# Patient Record
Sex: Male | Born: 1975 | Race: White | Hispanic: No | Marital: Married | Smoking: Never smoker
Health system: Southern US, Community
[De-identification: ages and names within clinical notes are randomized; demographics above are authoritative.]

## PROBLEM LIST (undated history)

## (undated) DIAGNOSIS — E785 Hyperlipidemia, unspecified: Secondary | ICD-10-CM

## (undated) DIAGNOSIS — S8290XA Unspecified fracture of unspecified lower leg, initial encounter for closed fracture: Secondary | ICD-10-CM

## (undated) DIAGNOSIS — Z808 Family history of malignant neoplasm of other organs or systems: Secondary | ICD-10-CM

## (undated) DIAGNOSIS — Z8049 Family history of malignant neoplasm of other genital organs: Secondary | ICD-10-CM

## (undated) DIAGNOSIS — Z800 Family history of malignant neoplasm of digestive organs: Secondary | ICD-10-CM

## (undated) DIAGNOSIS — E119 Type 2 diabetes mellitus without complications: Secondary | ICD-10-CM

## (undated) HISTORY — PX: APPENDECTOMY: SHX54

## (undated) HISTORY — DX: Family history of malignant neoplasm of other organs or systems: Z80.8

## (undated) HISTORY — DX: Hyperlipidemia, unspecified: E78.5

## (undated) HISTORY — DX: Type 2 diabetes mellitus without complications: E11.9

## (undated) HISTORY — PX: TESTICLE TORSION REDUCTION: SHX795

## (undated) HISTORY — DX: Family history of malignant neoplasm of digestive organs: Z80.0

## (undated) HISTORY — DX: Unspecified fracture of unspecified lower leg, initial encounter for closed fracture: S82.90XA

## (undated) HISTORY — DX: Family history of malignant neoplasm of other genital organs: Z80.49

---

## 2001-06-21 ENCOUNTER — Ambulatory Visit (HOSPITAL_COMMUNITY): Admission: RE | Admit: 2001-06-21 | Discharge: 2001-06-21 | Payer: Self-pay | Admitting: Urology

## 2001-06-21 ENCOUNTER — Encounter: Payer: Self-pay | Admitting: Urology

## 2002-03-31 ENCOUNTER — Encounter: Payer: Self-pay | Admitting: *Deleted

## 2002-03-31 ENCOUNTER — Emergency Department (HOSPITAL_COMMUNITY): Admission: EM | Admit: 2002-03-31 | Discharge: 2002-03-31 | Payer: Self-pay | Admitting: *Deleted

## 2003-01-14 ENCOUNTER — Emergency Department (HOSPITAL_COMMUNITY): Admission: EM | Admit: 2003-01-14 | Discharge: 2003-01-14 | Payer: Self-pay | Admitting: *Deleted

## 2003-02-03 ENCOUNTER — Emergency Department (HOSPITAL_COMMUNITY): Admission: EM | Admit: 2003-02-03 | Discharge: 2003-02-03 | Payer: Self-pay | Admitting: Emergency Medicine

## 2003-02-03 ENCOUNTER — Encounter: Payer: Self-pay | Admitting: Emergency Medicine

## 2004-12-18 ENCOUNTER — Ambulatory Visit (HOSPITAL_COMMUNITY): Admission: RE | Admit: 2004-12-18 | Discharge: 2004-12-18 | Payer: Self-pay | Admitting: Internal Medicine

## 2004-12-19 ENCOUNTER — Ambulatory Visit (HOSPITAL_COMMUNITY): Admission: RE | Admit: 2004-12-19 | Discharge: 2004-12-19 | Payer: Self-pay | Admitting: Internal Medicine

## 2005-06-19 ENCOUNTER — Ambulatory Visit (HOSPITAL_COMMUNITY): Admission: RE | Admit: 2005-06-19 | Discharge: 2005-06-19 | Payer: Self-pay | Admitting: Internal Medicine

## 2006-04-08 ENCOUNTER — Ambulatory Visit: Payer: Self-pay | Admitting: Internal Medicine

## 2006-04-14 ENCOUNTER — Ambulatory Visit: Payer: Self-pay | Admitting: Internal Medicine

## 2006-04-14 ENCOUNTER — Ambulatory Visit (HOSPITAL_COMMUNITY): Admission: RE | Admit: 2006-04-14 | Discharge: 2006-04-14 | Payer: Self-pay | Admitting: Internal Medicine

## 2006-04-16 ENCOUNTER — Ambulatory Visit (HOSPITAL_COMMUNITY): Admission: RE | Admit: 2006-04-16 | Discharge: 2006-04-16 | Payer: Self-pay | Admitting: Internal Medicine

## 2006-05-04 ENCOUNTER — Ambulatory Visit (HOSPITAL_COMMUNITY): Admission: RE | Admit: 2006-05-04 | Discharge: 2006-05-04 | Payer: Self-pay | Admitting: Internal Medicine

## 2006-08-05 ENCOUNTER — Encounter (INDEPENDENT_AMBULATORY_CARE_PROVIDER_SITE_OTHER): Payer: Self-pay | Admitting: Specialist

## 2006-08-05 ENCOUNTER — Observation Stay (HOSPITAL_COMMUNITY): Admission: EM | Admit: 2006-08-05 | Discharge: 2006-08-06 | Payer: Self-pay | Admitting: Emergency Medicine

## 2006-08-05 ENCOUNTER — Ambulatory Visit (HOSPITAL_COMMUNITY): Admission: RE | Admit: 2006-08-05 | Discharge: 2006-08-05 | Payer: Self-pay | Admitting: Internal Medicine

## 2007-03-12 ENCOUNTER — Encounter: Admission: RE | Admit: 2007-03-12 | Discharge: 2007-03-12 | Payer: Self-pay | Admitting: Occupational Medicine

## 2007-05-17 ENCOUNTER — Encounter: Admission: RE | Admit: 2007-05-17 | Discharge: 2007-05-17 | Payer: Self-pay | Admitting: Occupational Medicine

## 2007-06-21 ENCOUNTER — Ambulatory Visit: Admission: RE | Admit: 2007-06-21 | Discharge: 2007-06-21 | Payer: Self-pay | Admitting: Internal Medicine

## 2007-11-01 ENCOUNTER — Emergency Department (HOSPITAL_COMMUNITY): Admission: EM | Admit: 2007-11-01 | Discharge: 2007-11-01 | Payer: Self-pay | Admitting: Emergency Medicine

## 2007-11-02 ENCOUNTER — Ambulatory Visit (HOSPITAL_COMMUNITY): Admission: RE | Admit: 2007-11-02 | Discharge: 2007-11-02 | Payer: Self-pay | Admitting: Internal Medicine

## 2008-12-18 ENCOUNTER — Emergency Department (HOSPITAL_COMMUNITY): Admission: EM | Admit: 2008-12-18 | Discharge: 2008-12-18 | Payer: Self-pay | Admitting: Emergency Medicine

## 2009-06-29 ENCOUNTER — Ambulatory Visit (HOSPITAL_COMMUNITY): Admission: RE | Admit: 2009-06-29 | Discharge: 2009-06-29 | Payer: Self-pay | Admitting: General Surgery

## 2010-03-04 ENCOUNTER — Ambulatory Visit (HOSPITAL_COMMUNITY): Admission: RE | Admit: 2010-03-04 | Discharge: 2010-03-04 | Payer: Self-pay | Admitting: Family Medicine

## 2010-05-19 ENCOUNTER — Encounter: Payer: Self-pay | Admitting: Internal Medicine

## 2010-06-12 ENCOUNTER — Encounter: Payer: Self-pay | Admitting: Cardiology

## 2010-06-12 ENCOUNTER — Ambulatory Visit (INDEPENDENT_AMBULATORY_CARE_PROVIDER_SITE_OTHER): Payer: 59 | Admitting: Cardiology

## 2010-06-12 DIAGNOSIS — R079 Chest pain, unspecified: Secondary | ICD-10-CM

## 2010-06-12 DIAGNOSIS — E119 Type 2 diabetes mellitus without complications: Secondary | ICD-10-CM

## 2010-06-12 DIAGNOSIS — R9431 Abnormal electrocardiogram [ECG] [EKG]: Secondary | ICD-10-CM

## 2010-06-12 DIAGNOSIS — E785 Hyperlipidemia, unspecified: Secondary | ICD-10-CM | POA: Insufficient documentation

## 2010-06-12 DIAGNOSIS — E7800 Pure hypercholesterolemia, unspecified: Secondary | ICD-10-CM

## 2010-06-12 DIAGNOSIS — R209 Unspecified disturbances of skin sensation: Secondary | ICD-10-CM | POA: Insufficient documentation

## 2010-06-13 ENCOUNTER — Encounter: Payer: Self-pay | Admitting: Cardiology

## 2010-06-19 ENCOUNTER — Ambulatory Visit (HOSPITAL_COMMUNITY)
Admission: RE | Admit: 2010-06-19 | Discharge: 2010-06-19 | Disposition: A | Discharge status: Home / Self Care | Payer: 59 | Source: Ambulatory Visit | Attending: Cardiology | Admitting: Cardiology

## 2010-06-19 ENCOUNTER — Encounter: Payer: Self-pay | Admitting: Cardiology

## 2010-06-19 ENCOUNTER — Ambulatory Visit (HOSPITAL_COMMUNITY): Payer: 59 | Attending: Cardiology

## 2010-06-19 DIAGNOSIS — R0789 Other chest pain: Secondary | ICD-10-CM | POA: Insufficient documentation

## 2010-06-19 DIAGNOSIS — R079 Chest pain, unspecified: Secondary | ICD-10-CM | POA: Insufficient documentation

## 2010-06-19 DIAGNOSIS — R072 Precordial pain: Secondary | ICD-10-CM

## 2010-06-19 NOTE — Letter (Signed)
Summary: Stress Echocardiogram Information Sheet  [facility] at [facility]  [address] [place] [address]   Phone: [phone]  Fax: [phone]      June 12, 2010 MRN: [id number] light prior to the test.   Pastor Bunker  Doctor: Appointment Date: Appointment Time: Appointment Location: [facility]  Stress Echocardiogram Information Sheet    Instructions:   1. DO NOT  take your ___Glucovance______ medicine _the night before cath or the morning of test.  2. Do not eat or drink after midnight the night before test.  3. Dress prepared to exercise.  4. DO NOT use ANY caffine or tobacco products 3 hours before appointment.  5. Report to the Short Stay Center on the1st floor.  6. Please bring all current prescription medications.  7. If you have any questions, please call [phone]

## 2010-06-19 NOTE — Letter (Signed)
Summary: External Correspondence  External Correspondence   Imported By: Faythe Ghee 06/13/2010 10:58:01  _____________________________________________________________________  External Attachment:    Type:   Image     Comment:   External Document

## 2010-06-19 NOTE — Assessment & Plan Note (Signed)
Summary: ***NP6  CP, ARM NUMBNESS, NEW DX OF DM   Visit Type:  Initial Consult Primary Provider:  Dr. Assunta Found   History of Present Illness: 35 year old male referred for cardiology consultation. He has a history of recently diagnosed type 2 diabetes mellitus as of January, also a family history of premature cardiovascular disease. He states that he has lost 42 pounds over the last few months, and is beginning to feel better. He also reports compliance with his medications.  He states that over the last few months he has experienced a "numbness and tingling" in his left arm and left leg. He indicates that this has gotten much better with the interventions noted above. Only very occasionally does he experience a mild, lower mid chest discomfort, specifically when he is under stress or emotionally upset. He does not report any clear-cut exertional angina.  He states that he underwent a stress test approximately 5 years ago that was reportedly reassuring. He has had no interval followup, certainly not since his diagnosis of diabetes mellitus.  Current Medications (verified): 1)  Lipitor 10 Mg Tabs (Atorvastatin Calcium) .... Take 1 Tab Daily 2)  Glucovance 2.5-500 Mg Tabs (Glyburide-Metformin) .... Take 1 Tab Two Times A Day 3)  Claritin 10 Mg Tabs (Loratadine) .... Take 1 Tab Daily  Allergies (verified): 1)  ! * Pencillin  Comments:  Nurse/Medical Assistant: [facility] no meds reviewed meds from Dr.Goldings records  Past History:  Family History: Last updated: 06/12/2010 Father: heart disease in his 56s Mother: no known medical conditions Paternal uncle: died of MI at age 47  Social History: Last updated: 06/12/2010 Full Time - landscaping Married  Tobacco Use - No.  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  Alcohol Use - no Regular Exercise - no Drug Use - no  Review of Systems  The patient denies anorexia, fever, weight gain, syncope, dyspnea on exertion, peripheral edema, prolonged cough, hemoptysis, melena, hematochezia, and severe indigestion/heartburn.         Otherwise reviewed and negative except as outlined.  Vital Signs:  Patient profile:   35 year old male Height:      69 inches Weight:      212 pounds BMI:     31.42 Pulse rate:   85 / minute BP sitting:   115 / 75  (left arm)  Vitals Entered By: Dreama Saa, CNA (June 12, 2010 2:42 PM)  Physical Exam  Additional Exam:  Overweight male in no acute distress. HEENT: Conjunctiva and lids normal, oropharynx with moist mucosa. Neck: Supple, no elevated JVP or bruits. Lungs: Clear to auscultation, nonlabored. Cardiac: Regular rate and rhythm, no significant systolic murmur or pericardial rub, no S3. S1  Abdomen: Soft, nontender, bowel sounds present. Skin: Warm and dry. Musculoskeletal: No kyphosis. Neuropsychiatric: Alert and oriented x3, affect appropriate.   EKG  Procedure date:  06/12/2010  Findings:      Sinus rhythm at 78 beats per minute with nonspecific ST-T wave changes.  Impression & Recommendations:  Problem # 1:  CHEST PAIN (ICD-786.50)  Largely atypical, associated with emotional upset and stress, not  clearly exertional. This is noted however in the setting of nonspecific abnormal ECG at baseline, recently diagnosed diabetes mellitus with significant hypertriglyceridemia, and a family history of premature cardiovascular disease. Plan will be to further risk stratify with an exercise echocardiogram. We will inform him of the results.  Orders: Stress Echo (Stress Echo)  Problem #  2:  ARM NUMBNESS (ICD-782.0)  Specifically left arm and left leg numbness, nonexertional, actually much improved per patient report. Very atypical for ischemic symptoms. Could perhaps be neuropathic.  Problem # 3:  DM (ICD-250.00)  Recently diagnosed in January. Patient reports compliance with medications, and is working on continued weight loss.  His updated medication list for this problem includes:    Glucovance 2.5-500 Mg Tabs (Glyburide-metformin) .Marland Kitchen... Take 1 tab two times a day  Problem # 4:  HYPERLIPIDEMIA (ICD-272.4)  Now on statin therapy. Triglycerides should improve with better glucose control. May need to also consider using omega-3 supplements.  His updated medication list for this problem includes:    Lipitor 10 Mg Tabs (Atorvastatin calcium) .Marland Kitchen... Take 1 tab daily  Patient Instructions: 1)  Your physician recommends that you schedule a follow-up appointment in: we will contact you with results of Stress Echo 2)  Your physician recommends that you continue on your current medications as directed. Please refer to the Current Medication list given to you today. 3)  Your physician has requested that you have a stress echocardiogram. For further information please visit [email or web address]  Please follow instruction sheet as given.

## 2010-06-20 ENCOUNTER — Encounter: Payer: Self-pay | Admitting: Cardiology

## 2010-08-03 LAB — URINALYSIS, ROUTINE W REFLEX MICROSCOPIC
Ketones, ur: NEGATIVE mg/dL
Leukocytes, UA: NEGATIVE
Nitrite: NEGATIVE
Protein, ur: NEGATIVE mg/dL
pH: 5.5 (ref 5.0–8.0)

## 2010-09-10 NOTE — Procedures (Signed)
NAMEHELTON, Cameron Lee                  ACCOUNT NO.:  [id number]   MEDICAL RECORD NO.:  [id number]          PATIENT TYPE:  OUT   LOCATION:  SLEE                          FACILITY:  [facility]   PHYSICIAN:  Kofi A. Gerilyn Pilgrim, M.D. DATE OF BIRTH:  1975/05/12   DATE OF PROCEDURE:  06/21/2007  DATE OF DISCHARGE:  06/21/2007                             SLEEP DISORDER REPORT   REFERRING PHYSICIAN:  [name], MD   INDICATION:  This is a 35 year old who presents with hypersomnia and is  being evaluated for obstructive sleep apnea syndrome.  Epworth  sleepiness  scale of 12.   MEDICATIONS:  Allegra.   BMI 35.   Sleep Study Summary:  This was a split-night study with a total  recording time in  the diagnostic portion 158 minutes and the titration  time 293 minutes.  Sleep efficiency during the diagnostic portion is 57%  and 91% in the titration portion.  The sleep latency is 23.  Diagnostics  portion, there is no REM in the diagnostic portion.  The REM latency in  the titration portion is 113 minutes.   Respiratory Summary:  Baseline oxygen saturation 98% and lowest oxygen  saturation 87%.  The AHI is 36.1 with 55 hypopneas.  There were no  apneas noted.  The patient was titrated between the pressure of 5 and 7  with an optimal pressure of 6.  She had no events on the optimal  pressure.   Limb Movement Summary:  The PLM index is 4.   Electrocardiogram Index:  Average heart rate 63 with no dysrhythmias  noted.   IMPRESSION:  Moderately severe obstructive sleep apnea syndrome which  responded very well to a CPAP of 6.      Kofi A. Gerilyn Pilgrim, M.D.  Electronically Signed     KAD/MEDQ  D:  06/23/2007  T:  06/23/2007  Job:  [id number]

## 2010-09-13 NOTE — Op Note (Signed)
Cameron Lee, Cameron Lee                  ACCOUNT NO.:  [id number]   MEDICAL RECORD NO.:  [id number]          PATIENT TYPE:  AMB   LOCATION:  DAY                           FACILITY:  [facility]   PHYSICIAN:  Lionel December, M.D.    DATE OF BIRTH:  1975/05/09   DATE OF PROCEDURE:  04/14/2006  DATE OF DISCHARGE:                               OPERATIVE REPORT   PROCEDURE:  Esophagogastroduodenoscopy.   INDICATION:  Cameron Lee is a 35 year old Caucasian male with over a month  history of epigastric pain associated with some nausea which has not  responded to PPI.  He had an ultrasound last year which was negative for  cholelithiasis.  He is undergoing diagnostic EGD.  Procedure risks were  reviewed with the patient, informed consent was obtained.   MEDICATIONS FOR CONSCIOUS SEDATION:  Benzocaine spray for oropharyngeal  topical anesthesia, Demerol 50 mg IV and Versed 8 mg IV.   FINDINGS:  Procedure performed in endoscopy suite.  The patient's vital  signs and O2 sats were monitored during the procedure and remained  stable.  The patient was placed in the left lateral recumbent position  and Olympus videoscope was passed per oropharynx without any difficulty  into esophagus.   Esophagus:  Mucosa of esophagus was normal.  GE junction was at 40 cm  from the incisors was unremarkable.   Stomach:  It was empty and distended very well with insufflation.  Folds  of proximal stomach were normal.  Mucosa at body, antrum, pyloric  channel as well as angularis, fundus and cardia was normal.   Duodenum:  Bulbar mucosa was normal.  Scope was passed to second part of  the duodenum where mucosa and folds were normal.  Endoscope was  withdrawn.  The patient tolerated the procedure well.   FINAL DIAGNOSIS:  Normal esophagogastroduodenoscopy.   RECOMMENDATIONS:  Will discontinue his Levsin SL and start Levbid 1  tablet every morning, prescription given for 30 with five refills.   Will proceed with abdominal CT but  this will have to be first  precertified by his plan.      Lionel December, M.D.  Electronically Signed     NR/MEDQ  D:  04/14/2006  T:  04/14/2006  Job:  [id number]   cc:   [name], MD  Fax: [phone]

## 2010-09-13 NOTE — Procedures (Signed)
NAMELEODAN, Cameron Lee NO.:  [id number]   MEDICAL RECORD NO.:  [id number]          PATIENT TYPE:  OUT   LOCATION:  DFTL                          FACILITY:  [facility]   PHYSICIAN:  [name], MD       DATE OF BIRTH:  1976/02/07   DATE OF PROCEDURE:  12/19/2004  DATE OF DISCHARGE:                                    STRESS TEST   HISTORY OF PRESENT ILLNESS:  The patient underwent an exercise stress test  for evaluation of recent symptoms of chest pain and shortness of breath.  He  exercised 10 minutes (one minute into stage 4 of the Bruce protocol)  obtaining a maximal heart rate of 186 (97% of the age predicted maximum  heart rate), had a work load of 12.8 mets and discontinued exercise due to  fatigue.  There were no symptoms of chest pain.  There were no arrhythmias.  There were no ST segment changes diagnostic of ischemia.  His baseline  electrocardiogram revealed normal sinus rhythm at 80 beats per minute with  nonspecific T wave changes.   IMPRESSION:  No evidence of exercise induced ischemia.  He had a negative  chest x-ray yesterday. His oxygen saturation was 100%.  His blood counts  were normal.  His chemistries revealed mild transaminase elevations that  will be rechecked in two months.  He felt his recent symptoms were likely  anxiety related.  He was treated with Klonopin 0.5 mg t.i.d. p.r.n. #30 with  3 refills.      [name], MD  Electronically Signed     ROF/MEDQ  D:  12/19/2004  T:  12/20/2004  Job:  [id number]

## 2010-09-13 NOTE — Consult Note (Signed)
Cameron, Lee                  ACCOUNT NO.:  [id number]   MEDICAL RECORD NO.:  [id number]         PATIENT TYPE:  AMB   LOCATION:                                FACILITY:  [facility]   PHYSICIAN:  Cameron Lee, M.D.    DATE OF BIRTH:  Oct 12, 1975   DATE OF CONSULTATION:  04/08/2006  DATE OF DISCHARGE:                                 CONSULTATION   REASON FOR CONSULTATION:  Epigastric pain.   REQUESTING PHYSICIAN:  Cameron Callander. Cameron Sills, MD   HISTORY OF PRESENT ILLNESS:  Cameron Lee is a 36 year old Caucasian gentleman  who has a 2-3 week history of epigastric pain which is intermittent but  daily.  It does not seem to be related to meals.  It sometimes seems to  be exacerbated if he lifts heavy objects.  Last week, he noted it to be  worse when he was swallowing food; however, he denies any dysphagia.  No  nausea or vomiting.  For several months, he has had soft to loose  stools, 4-5 daily.  Dr. Ouida Lee has had him on hyoscyamine for possible  IBS.  He also has intermittent hematochezia, bright red blood per  rectum, sometimes moderate volume.  Denies any melena.  He said he had a  colonoscopy a couple of years ago by Dr. Lovell Lee.  Was told he had  internal hemorrhoids.   Patient has a history of fatty liver.  He states that he lost about 20  pounds but gained it all back.  He started his diet again this week.  He  has had elevated LFTs in the past related to his fatty liver.  Apparently had an ultrasound in 2006 which revealed fatty liver but no  gallstones.  He had a negative exercise stress test in 2006.   CURRENT MEDICATIONS:  1. Zyrtec 1 daily.  2. Hyoscyamine 1 daily.  3. Sertraline 1 daily.  4. Nexium 40 mg daily.   ALLERGIES:  PENICILLIN.   PAST MEDICAL HISTORY:  Seasonal allergies, anxiety, irritability,  insomnia.   He had some sort of surgery at age 16.  He is not sure if it was related  to his inguinal hernia or maybe a testicle torsion.  Details  unavailable.   FAMILY HISTORY:   Mother is 64 and has breast cancer.  Father is 9, has  heart disease.  No family history of colorectal cancer.   SOCIAL HISTORY:  He is married with a 48-year-old son.  He works at [employer].  He has never been a smoker, rarely consumes  alcohol.  Has not consumed alcohol in many months.   REVIEW OF SYSTEMS:  See HPI for GI.  CARDIOPULMONARY:  No chest pain or  shortness of breath, palpitations.   PHYSICAL EXAMINATION:  VITAL SIGNS:  Weight 234, height 5 feet 9.  Temperature 97.9, blood pressure 138/88, pulse 60.  GENERAL:  A pleasant, moderately obese Caucasian male in no acute  distress.  SKIN:  Warm and dry.  No jaundice.  HEENT:  Sclerae are anicteric.  Oropharyngeal mucosa moist and pink.  No  lesions, erythema, or exudate.  NECK:  No lymphadenopathy or thyromegaly.  CHEST:  Lungs are clear to auscultation.  CARDIAC:  Regular rate and rhythm.  No murmurs, rubs or gallops.  ABDOMEN:  Positive bowel sounds.  Soft, nondistended.  Slightly obese  but symmetrical.  He has mild epigastric tenderness to deep palpation.  No obvious abdominal hernias.  No abdominal bruits.  EXTREMITIES:  No edema.   IMPRESSION:  Cameron Lee is a 35 year old gentleman with a several-week history  of epigastric discomfort, which has not responded to Nexium.  He does  not really describe typical reflux symptoms.  Cannot exclude the  possibility of hiatal hernia, gastritis.  Doubt peptic ulcer disease.  He is not on any NSAIDs or aspirin.  He also complains of frequent soft  stools, especially post prandially.  He may have irritable bowel  syndrome.  He also has some hematochezia, possibly due to a benign  anorectal source such as internal hemorrhoids, but we need to retrieve  his last colonoscopy report to make sure it is up to date.   PLAN:  1. EGD in the near future with Dr. Karilyn Cota.  2. Continue Nexium 40 mg daily.  Two boxes of samples provided.  3. Retrieve colonoscopy report from  [facility] with Dr.      Lovell Lee, done a couple of years ago.  4. Recommend slow gradual weight loss, daily exercise, and recheck      LFTs in a few months.  Can be done with Dr. Ouida Lee for fatty liver.      Cameron Lee, P.A.      Cameron Lee, M.D.  Electronically Signed    LL/MEDQ  D:  04/08/2006  T:  04/08/2006  Job:  [id number]   cc:   Cameron Callander. Cameron Sills, MD  Fax: [phone]

## 2010-09-13 NOTE — Op Note (Signed)
NAMECRISTOFHER, Cameron Lee NO.:  [id number]   MEDICAL RECORD NO.:  [id number]          PATIENT TYPE:  OBV   LOCATION:  A311                          FACILITY:  [facility]   PHYSICIAN:  Dalia Heading, M.D.  DATE OF BIRTH:  03/15/1976   DATE OF PROCEDURE:  08/05/2006  DATE OF DISCHARGE:                               OPERATIVE REPORT   PREOPERATIVE DIAGNOSIS:  Acute appendicitis.   POSTOPERATIVE DIAGNOSIS:  Acute appendicitis.   PROCEDURE:  Laparoscopic appendectomy.   SURGEON:  Dalia Heading, M.D.   ANESTHESIA:  General endotracheal.   INDICATIONS:  The patient is a 35 year old white male who presented to  Dr. Alonza Smoker office with right lower quadrant abdominal pain.  CT scan of  the abdomen and pelvis was performed which revealed acute appendicitis.  The patient now comes to the operating room for laparoscopic  appendectomy.  The risks and benefits of the procedure including  bleeding, infection, and the possibility of an open procedure were fully  explained to the patient who gave informed consent.   PROCEDURE NOTE:  The patient was placed in the supine position.  After  induction of general endotracheal anesthesia, the abdomen was prepped  and draped using the usual sterile technique with Betadine.  Surgical  site confirmation was performed.  A supraumbilical incision was made  down to the fascia.  A Veress needle was introduced into the abdominal  cavity and confirmation of placement was done using the saline drop  test.  The abdomen was then insufflated to 16 mmHg pressure.  An 11 mm  trocar was introduced into the abdominal cavity under direct  visualization without difficulty.  The patient was placed in deeper  Trendelenburg position.  An additional 12 mm trocar was placed in the  suprapubic region and 5 mm trocar was placed in the left lower quadrant  region.  The appendix was visualized and noted to be acutely inflamed.  There was no evidence of  perforation.  The mesoappendix was divided  using the harmonic scalpel.  Vascular Endo-GIA was placed across the  base of the appendix and fired.  The appendix was removed using the  EndoCatch bag without difficulty.  The appendiceal stump was inspected  and the staple line was noted be intact.  All fluid and air were then  evacuated from the abdominal cavity prior to removal of the trocars.   All wounds were irrigated with normal saline.  All wounds were injected  0.5% Sensorcaine.  The supraumbilical fascia as well as suprapubic  fascia were reapproximated using 0 Vicryl interrupted sutures.  All skin  incisions were closed using staples.  Betadine ointment and dry sterile  dressings were applied.  All tape and needle counts were correct at the  end of the procedure.  The patient was extubated in the operating room  and went back to the recovery room awake in stable condition.   COMPLICATIONS:  None.   SPECIMEN:  Appendix.   BLOOD LOSS:  Minimal.      Dalia Heading, M.D.  Electronically Signed  MAJ/MEDQ  D:  08/05/2006  T:  08/05/2006  Job:  [id number]   cc:   [name], MD  Fax: [phone]

## 2011-01-23 LAB — DIFFERENTIAL
Basophils Relative: 0
Eosinophils Relative: 1
Lymphocytes Relative: 20
Monocytes Absolute: 1.2 — ABNORMAL HIGH
Monocytes Relative: 8

## 2011-01-23 LAB — BASIC METABOLIC PANEL
BUN: 19
Calcium: 9.3
Creatinine, Ser: 0.93
GFR calc Af Amer: >60
GFR calc non Af Amer: >60
Glucose, Bld: 126 — ABNORMAL HIGH
Potassium: 3.7
Sodium: 140

## 2011-01-23 LAB — CBC
Hemoglobin: 14.8
MCHC: 33.7

## 2011-01-23 LAB — POCT CARDIAC MARKERS
CKMB, poc: 1.6
Operator id: 179121

## 2011-01-23 LAB — CK TOTAL AND CKMB (NOT AT ARMC)
CK, MB: 1.3
Relative Index: 1
Total CK: 127

## 2011-01-23 LAB — TROPONIN I: Troponin I: 0.03

## 2011-04-14 ENCOUNTER — Encounter: Payer: Self-pay | Admitting: Cardiology

## 2012-01-19 ENCOUNTER — Other Ambulatory Visit (HOSPITAL_COMMUNITY): Payer: Self-pay | Admitting: Family Medicine

## 2012-01-19 ENCOUNTER — Ambulatory Visit (HOSPITAL_COMMUNITY)
Admission: RE | Admit: 2012-01-19 | Discharge: 2012-01-19 | Disposition: A | Discharge status: Home / Self Care | Payer: 59 | Source: Ambulatory Visit | Attending: Family Medicine | Admitting: Family Medicine

## 2012-01-19 DIAGNOSIS — R1012 Left upper quadrant pain: Secondary | ICD-10-CM

## 2012-01-19 LAB — POCT I-STAT, CHEM 8
Calcium, Ion: 1.19 mmol/L (ref 1.12–1.23)
HCT: 48 % (ref 39.0–52.0)
Sodium: 140 mEq/L (ref 135–145)
TCO2: 24 mmol/L (ref 0–100)

## 2012-01-19 MED ORDER — IOHEXOL 300 MG/ML  SOLN
100.0000 mL | Freq: Once | INTRAMUSCULAR | Status: AC | PRN
  Administered 2012-01-19: 100 mL via INTRAVENOUS

## 2012-01-19 NOTE — Progress Notes (Signed)
Blood sample obtained from left arm IV for Creatnine level.  

## 2016-04-28 DIAGNOSIS — G4733 Obstructive sleep apnea (adult) (pediatric): Secondary | ICD-10-CM | POA: Diagnosis not present

## 2016-05-03 DIAGNOSIS — G8929 Other chronic pain: Secondary | ICD-10-CM | POA: Diagnosis not present

## 2016-05-03 DIAGNOSIS — M25512 Pain in left shoulder: Secondary | ICD-10-CM | POA: Diagnosis not present

## 2016-05-27 ENCOUNTER — Encounter: Payer: Self-pay | Admitting: Internal Medicine

## 2016-05-29 DIAGNOSIS — G4733 Obstructive sleep apnea (adult) (pediatric): Secondary | ICD-10-CM | POA: Diagnosis not present

## 2016-06-26 DIAGNOSIS — G4733 Obstructive sleep apnea (adult) (pediatric): Secondary | ICD-10-CM | POA: Diagnosis not present

## 2016-09-26 DIAGNOSIS — E119 Type 2 diabetes mellitus without complications: Secondary | ICD-10-CM | POA: Diagnosis not present

## 2016-09-26 DIAGNOSIS — E782 Mixed hyperlipidemia: Secondary | ICD-10-CM | POA: Diagnosis not present

## 2016-09-26 DIAGNOSIS — E1165 Type 2 diabetes mellitus with hyperglycemia: Secondary | ICD-10-CM | POA: Diagnosis not present

## 2016-12-31 DIAGNOSIS — E119 Type 2 diabetes mellitus without complications: Secondary | ICD-10-CM | POA: Diagnosis not present

## 2016-12-31 DIAGNOSIS — Z1389 Encounter for screening for other disorder: Secondary | ICD-10-CM | POA: Diagnosis not present

## 2016-12-31 DIAGNOSIS — E782 Mixed hyperlipidemia: Secondary | ICD-10-CM | POA: Diagnosis not present

## 2016-12-31 DIAGNOSIS — E1165 Type 2 diabetes mellitus with hyperglycemia: Secondary | ICD-10-CM | POA: Diagnosis not present

## 2017-05-07 DIAGNOSIS — E119 Type 2 diabetes mellitus without complications: Secondary | ICD-10-CM | POA: Diagnosis not present

## 2017-05-07 DIAGNOSIS — Z6830 Body mass index (BMI) 30.0-30.9, adult: Secondary | ICD-10-CM | POA: Diagnosis not present

## 2017-05-07 DIAGNOSIS — E6609 Other obesity due to excess calories: Secondary | ICD-10-CM | POA: Diagnosis not present

## 2017-09-07 DIAGNOSIS — E782 Mixed hyperlipidemia: Secondary | ICD-10-CM | POA: Diagnosis not present

## 2017-09-07 DIAGNOSIS — Z1389 Encounter for screening for other disorder: Secondary | ICD-10-CM | POA: Diagnosis not present

## 2017-09-07 DIAGNOSIS — K760 Fatty (change of) liver, not elsewhere classified: Secondary | ICD-10-CM | POA: Diagnosis not present

## 2017-09-07 DIAGNOSIS — E1165 Type 2 diabetes mellitus with hyperglycemia: Secondary | ICD-10-CM | POA: Diagnosis not present

## 2017-11-30 DIAGNOSIS — S134XXA Sprain of ligaments of cervical spine, initial encounter: Secondary | ICD-10-CM | POA: Diagnosis not present

## 2017-11-30 DIAGNOSIS — S233XXA Sprain of ligaments of thoracic spine, initial encounter: Secondary | ICD-10-CM | POA: Diagnosis not present

## 2017-11-30 DIAGNOSIS — M531 Cervicobrachial syndrome: Secondary | ICD-10-CM | POA: Diagnosis not present

## 2017-12-25 DIAGNOSIS — E782 Mixed hyperlipidemia: Secondary | ICD-10-CM | POA: Diagnosis not present

## 2017-12-25 DIAGNOSIS — K760 Fatty (change of) liver, not elsewhere classified: Secondary | ICD-10-CM | POA: Diagnosis not present

## 2017-12-25 DIAGNOSIS — E663 Overweight: Secondary | ICD-10-CM | POA: Diagnosis not present

## 2017-12-25 DIAGNOSIS — E1165 Type 2 diabetes mellitus with hyperglycemia: Secondary | ICD-10-CM | POA: Diagnosis not present

## 2018-01-24 ENCOUNTER — Emergency Department (HOSPITAL_COMMUNITY)
Admission: EM | Admit: 2018-01-24 | Discharge: 2018-01-24 | Disposition: A | Discharge status: Home / Self Care | Payer: 59 | Attending: Emergency Medicine | Admitting: Emergency Medicine

## 2018-01-24 ENCOUNTER — Emergency Department (HOSPITAL_COMMUNITY): Payer: 59

## 2018-01-24 ENCOUNTER — Encounter (HOSPITAL_COMMUNITY): Payer: Self-pay | Admitting: Emergency Medicine

## 2018-01-24 ENCOUNTER — Other Ambulatory Visit: Payer: Self-pay

## 2018-01-24 DIAGNOSIS — E119 Type 2 diabetes mellitus without complications: Secondary | ICD-10-CM | POA: Diagnosis not present

## 2018-01-24 DIAGNOSIS — Y990 Civilian activity done for income or pay: Secondary | ICD-10-CM | POA: Insufficient documentation

## 2018-01-24 DIAGNOSIS — Y93H3 Activity, building and construction: Secondary | ICD-10-CM | POA: Insufficient documentation

## 2018-01-24 DIAGNOSIS — W19XXXA Unspecified fall, initial encounter: Secondary | ICD-10-CM

## 2018-01-24 DIAGNOSIS — W1789XA Other fall from one level to another, initial encounter: Secondary | ICD-10-CM | POA: Diagnosis not present

## 2018-01-24 DIAGNOSIS — T7840XA Allergy, unspecified, initial encounter: Secondary | ICD-10-CM | POA: Diagnosis not present

## 2018-01-24 DIAGNOSIS — M546 Pain in thoracic spine: Secondary | ICD-10-CM | POA: Diagnosis not present

## 2018-01-24 DIAGNOSIS — Y9269 Other specified industrial and construction area as the place of occurrence of the external cause: Secondary | ICD-10-CM | POA: Diagnosis not present

## 2018-01-24 DIAGNOSIS — S22080A Wedge compression fracture of T11-T12 vertebra, initial encounter for closed fracture: Secondary | ICD-10-CM | POA: Diagnosis not present

## 2018-01-24 DIAGNOSIS — R0600 Dyspnea, unspecified: Secondary | ICD-10-CM | POA: Diagnosis not present

## 2018-01-24 DIAGNOSIS — Z7984 Long term (current) use of oral hypoglycemic drugs: Secondary | ICD-10-CM | POA: Diagnosis not present

## 2018-01-24 DIAGNOSIS — S299XXA Unspecified injury of thorax, initial encounter: Secondary | ICD-10-CM | POA: Diagnosis not present

## 2018-01-24 DIAGNOSIS — T63441A Toxic effect of venom of bees, accidental (unintentional), initial encounter: Secondary | ICD-10-CM | POA: Diagnosis not present

## 2018-01-24 LAB — BASIC METABOLIC PANEL
ANION GAP: 14 (ref 5–15)
BUN: 14 mg/dL (ref 6–20)
CHLORIDE: 102 mmol/L (ref 98–111)
CO2: 23 mmol/L (ref 22–32)
Calcium: 9.5 mg/dL (ref 8.9–10.3)
Creatinine, Ser: 1.03 mg/dL (ref 0.61–1.24)
GFR calc Af Amer: >60 mL/min (ref >60)
GLUCOSE: 343 mg/dL — AB (ref 70–99)
POTASSIUM: 4.1 mmol/L (ref 3.5–5.1)
Sodium: 139 mmol/L (ref 135–145)

## 2018-01-24 LAB — CBC WITH DIFFERENTIAL/PLATELET
BASOS ABS: 0.1 10*3/uL (ref 0.0–0.1)
Basophils Relative: 1 %
Eosinophils Absolute: 0.3 10*3/uL (ref 0.0–0.7)
Eosinophils Relative: 3 %
HCT: 46.7 % (ref 39.0–52.0)
HEMOGLOBIN: 16 g/dL (ref 13.0–17.0)
LYMPHS PCT: 34 %
Lymphs Abs: 3 10*3/uL (ref 0.7–4.0)
MCH: 29.7 pg (ref 26.0–34.0)
MCHC: 34.3 g/dL (ref 30.0–36.0)
MCV: 86.8 fL (ref 78.0–100.0)
Monocytes Absolute: 0.6 10*3/uL (ref 0.1–1.0)
Monocytes Relative: 7 %
NEUTROS ABS: 5 10*3/uL (ref 1.7–7.7)
NEUTROS PCT: 57 %
Platelets: 301 10*3/uL (ref 150–400)
RBC: 5.38 MIL/uL (ref 4.22–5.81)
RDW: 13 % (ref 11.5–15.5)
WBC: 8.9 10*3/uL (ref 4.0–10.5)

## 2018-01-24 MED ORDER — SODIUM CHLORIDE 0.9 % IV BOLUS
1000.0000 mL | Freq: Once | INTRAVENOUS | Status: AC
  Administered 2018-01-24: 1000 mL via INTRAVENOUS

## 2018-01-24 MED ORDER — METHYLPREDNISOLONE SODIUM SUCC 125 MG IJ SOLR
INTRAMUSCULAR | Status: AC
  Filled 2018-01-24: qty 2

## 2018-01-24 MED ORDER — FENTANYL CITRATE (PF) 100 MCG/2ML IJ SOLN
50.0000 ug | Freq: Once | INTRAMUSCULAR | Status: AC
  Administered 2018-01-24: 50 ug via INTRAVENOUS

## 2018-01-24 MED ORDER — FENTANYL CITRATE (PF) 100 MCG/2ML IJ SOLN
INTRAMUSCULAR | Status: AC
  Filled 2018-01-24: qty 2

## 2018-01-24 MED ORDER — FAMOTIDINE IN NACL 20-0.9 MG/50ML-% IV SOLN
20.0000 mg | Freq: Once | INTRAVENOUS | Status: AC
  Administered 2018-01-24: 20 mg via INTRAVENOUS
  Filled 2018-01-24: qty 50

## 2018-01-24 MED ORDER — EPINEPHRINE 0.3 MG/0.3ML IJ SOAJ
0.3000 mg | Freq: Once | INTRAMUSCULAR | Status: AC
  Administered 2018-01-24: 0.3 mg via INTRAMUSCULAR
  Filled 2018-01-24: qty 0.3

## 2018-01-24 MED ORDER — ONDANSETRON HCL 4 MG/2ML IJ SOLN
4.0000 mg | Freq: Once | INTRAMUSCULAR | Status: AC
  Administered 2018-01-24: 4 mg via INTRAVENOUS
  Filled 2018-01-24: qty 2

## 2018-01-24 MED ORDER — MORPHINE SULFATE (PF) 4 MG/ML IV SOLN
4.0000 mg | Freq: Once | INTRAVENOUS | Status: AC
  Administered 2018-01-24: 4 mg via INTRAVENOUS
  Filled 2018-01-24: qty 1

## 2018-01-24 MED ORDER — DIPHENHYDRAMINE HCL 50 MG/ML IJ SOLN
25.0000 mg | Freq: Once | INTRAMUSCULAR | Status: AC
  Administered 2018-01-24: 50 mg via INTRAVENOUS

## 2018-01-24 MED ORDER — CYCLOBENZAPRINE HCL 10 MG PO TABS: 10.0000 mg | ORAL_TABLET | Freq: Two times a day (BID) | ORAL | 0 refills | Status: DC | PRN

## 2018-01-24 MED ORDER — DIPHENHYDRAMINE HCL 50 MG/ML IJ SOLN
INTRAMUSCULAR | Status: AC
  Administered 2018-01-24: 50 mg via INTRAVENOUS
  Filled 2018-01-24: qty 1

## 2018-01-24 MED ORDER — MORPHINE SULFATE (PF) 4 MG/ML IV SOLN: 4.0000 mg | INTRAVENOUS | Status: DC | PRN

## 2018-01-24 MED ORDER — OXYCODONE-ACETAMINOPHEN 5-325 MG PO TABS: 1.0000 | ORAL_TABLET | ORAL | 0 refills | Status: DC | PRN

## 2018-01-24 MED ORDER — FAMOTIDINE IN NACL 20-0.9 MG/50ML-% IV SOLN
INTRAVENOUS | Status: AC
  Administered 2018-01-24: 20 mg via INTRAVENOUS
  Filled 2018-01-24: qty 50

## 2018-01-24 MED ORDER — METHYLPREDNISOLONE SODIUM SUCC 125 MG IJ SOLR
125.0000 mg | Freq: Once | INTRAMUSCULAR | Status: AC
  Administered 2018-01-24: 125 mg via INTRAVENOUS

## 2018-01-24 MED ORDER — IPRATROPIUM-ALBUTEROL 0.5-2.5 (3) MG/3ML IN SOLN
3.0000 mL | Freq: Once | RESPIRATORY_TRACT | Status: AC
Start: 2018-01-24 — End: 2018-01-24
  Administered 2018-01-24: 3 mL via RESPIRATORY_TRACT
  Filled 2018-01-24: qty 3

## 2018-01-24 NOTE — ED Notes (Signed)
[employer] called at this at this time to order pt's TLSO brace. Greg from [employer] to call back.

## 2018-01-24 NOTE — ED Notes (Signed)
Spoke with [employer] eta 2 hrs for TLSO brace

## 2018-01-24 NOTE — ED Notes (Signed)
[employer] is two hours away but is coming to apply TLSO  Pt is informed   DrC informed hat pt request more pain med

## 2018-01-24 NOTE — ED Notes (Signed)
To CT

## 2018-01-24 NOTE — ED Triage Notes (Signed)
Patient states he was stung multiple times by bee and fell off a bobcat. Patient c/o lower back pain from fall. Patient states syncopal episode after fall, unsure of how long he had syncopal episode. 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 Airway patent at this time with O2 sat of 100%. No urinary incontinency.

## 2018-01-24 NOTE — ED Notes (Signed)
Pt request pain meds

## 2018-01-24 NOTE — ED Provider Notes (Signed)
[facility] EMERGENCY DEPARTMENT Provider Note   CSN: [id number] Arrival date & time: 01/24/18  0913     History   Chief Complaint Chief Complaint  Patient presents with  . Allergic Reaction    HPI Cameron Lee is a 42 y.o. male.  Level 5 caveat for urgent need for intervention.  Patient was stung by bees all of his body while working on a bobcat (heavy equipment) and ultimately fell off the machine and landed on his back.  He now complains of sweating, difficulty swallowing, dyspnea, pain in his mid back.  No head or neck trauma.  No neurological deficits.  No bowel or bladder incontinence.     Past Medical History:  Diagnosis Date  . Diabetes mellitus type II   . Hyperlipidemia    TG 839  . Leg fracture    Left, age 31    Patient Active Problem List   Diagnosis Date Noted  . DM 06/12/2010  . HYPERLIPIDEMIA 06/12/2010  . ARM NUMBNESS 06/12/2010  . CHEST PAIN 06/12/2010  . NONSPECIFIC ABNORMAL ELECTROCARDIOGRAM 06/12/2010    Past Surgical History:  Procedure Laterality Date  . APPENDECTOMY    . TESTICLE TORSION REDUCTION     As a child        Home Medications    Prior to Admission medications   Medication Sig Start Date End Date Taking? Authorizing Provider  atorvastatin (LIPITOR) 10 MG tablet Take 10 mg by mouth daily.     Yes [provider]  escitalopram (LEXAPRO) 20 MG tablet Take 1 tablet by mouth daily. 01/19/18  Yes [provider]  JANUVIA 100 MG tablet Take 1 tablet by mouth daily. 01/19/18  Yes [provider]  JARDIANCE 25 MG TABS tablet Take 1 tablet by mouth daily. 01/19/18  Yes [provider]  loratadine (CLARITIN) 10 MG tablet Take 10 mg by mouth daily.     Yes [provider]  metFORMIN (GLUCOPHAGE) 1000 MG tablet Take 1 tablet by mouth 2 (two) times daily. 01/19/18  Yes [provider]  cyclobenzaprine (FLEXERIL) 10 MG tablet Take 1 tablet (10 mg total) by mouth 2 (two) times daily as  needed for muscle spasms. 01/24/18   Donnetta Hutching, MD  oxyCODONE-acetaminophen (PERCOCET) 5-325 MG tablet Take 1 tablet by mouth every 4 (four) hours as needed. 01/24/18   Donnetta Hutching, MD    Family History Family History  Problem Relation Age of Onset  . Heart attack Paternal Uncle   . Heart disease Father        53s    Social History Social History   Tobacco Use  . Smoking status: Never Smoker  . Smokeless tobacco: Never Used  Substance Use Topics  . Alcohol use: Yes    Comment: occasional  . Drug use: No     Allergies   Bee venom and Penicillins   Review of Systems Review of Systems  Unable to perform ROS: Acuity of condition     Physical Exam Updated Vital Signs BP 124/80   Pulse 87   Temp 97.6 F (36.4 C) (Oral)   Resp 13   Ht 5\' 9"  (1.753 m)   Wt 91.6 kg   SpO2 97%   BMI 29.83 kg/m   Physical Exam  Constitutional: He is oriented to person, place, and time.  Pale, diaphoretic.  HENT:  Head: Normocephalic and atraumatic.  Eyes: Conjunctivae are normal.  Neck: Neck supple.  No neck tenderness.  Cardiovascular: Normal rate and regular  rhythm.  Pulmonary/Chest: Effort normal and breath sounds normal.  Abdominal: Soft. Bowel sounds are normal.  Musculoskeletal: Normal range of motion.  Tender mid to lower back  Neurological: He is alert and oriented to person, place, and time.  Able to move legs and arms appropriately  Skin: Skin is warm and dry.  Psychiatric: He has a normal mood and affect. His behavior is normal.  Nursing note and vitals reviewed.    ED Treatments / Results  Labs (all labs ordered are listed, but only abnormal results are displayed) Labs Reviewed  BASIC METABOLIC PANEL - Abnormal; Notable for the following components:      Result Value   Glucose, Bld 343 (*)    All other components within normal limits  CBC WITH DIFFERENTIAL/PLATELET    EKG EKG Interpretation  Date/Time:  Sunday January 24 2018 09:22:13  EDT Ventricular Rate:  66 PR Interval:    QRS Duration: 101 QT Interval:  393 QTC Calculation: 412 R Axis:   23 Text Interpretation:  Sinus rhythm Borderline T wave abnormalities Confirmed by Donnetta Hutching ([id number]) on 01/24/2018 10:08:09 AM   Radiology Dg Thoracic Spine 2 View  Result Date: 01/24/2018 CLINICAL DATA:  Pain after fall. EXAM: THORACIC SPINE 2 VIEWS COMPARISON:  None. FINDINGS: Mild anterior wedging of at least 1 lower thoracic vertebral body. This region will prior the better evaluated on the lumbar spine films. No other thoracic abnormalities noted. IMPRESSION: Mild anterior wedging of at least 1 lower thoracic vertebral body. This region is better evaluated and discussed on the lumbar spine films. Electronically Signed   By: Gerome Sam III M.D   On: 01/24/2018 11:04   Dg Lumbar Spine Complete  Result Date: 01/24/2018 CLINICAL DATA:  Pain after trauma EXAM: LUMBAR SPINE - COMPLETE 4+ VIEW COMPARISON:  CT scan January 19, 2012 FINDINGS: Mild anterior wedging with a associated step-off of T12. A similar finding is seen at T11. These findings are consistent with mild compression deformities of T11 and T12. These compression fractures have an acute appearance. No extension into the posterior elements is visualized. No inter pedicular widening. No other fractures are seen. No traumatic malalignment noted. IMPRESSION: 1. Acute compression fractures of T11 and T12 with between 5 and 10% loss of anterior height and a cortical step-off/irregularity. No evidence of posterior element extension. Electronically Signed   By: Gerome Sam III M.D   On: 01/24/2018 11:08   Ct Thoracic Spine Wo Contrast  Result Date: 01/24/2018 CLINICAL DATA:  Status post fall.  Back pain. EXAM: CT THORACIC SPINE WITHOUT CONTRAST TECHNIQUE: Multidetector CT images of the thoracic were obtained using the standard protocol without intravenous contrast. COMPARISON:  None. FINDINGS: Alignment: Normal. Vertebrae:  No discitis or osteomyelitis. No aggressive osseous lesion. Acute anterior compression fractures of the T11 and T12 vertebral bodies with approximately 15% height loss. Remainder the vertebral body heights are maintained. Possible nondisplaced fracture at the tip of the right L2 transverse process. No other fracture. Paraspinal and other soft tissues: No acute paraspinal abnormality. Disc levels: Disc spaces are maintained.  No foraminal stenosis. IMPRESSION: 1. Acute anterior compression fractures of the T11 and T12 vertebral bodies with 15% height loss. Electronically Signed   By: Elige Ko   On: 01/24/2018 12:39    Procedures Procedures (including critical care time)  Medications Ordered in ED Medications  sodium chloride 0.9 % bolus 1,000 mL (1,000 mLs Intravenous New Bag/Given 01/24/18 1411)  morphine 4 MG/ML injection 4 mg (has no administration  in time range)  sodium chloride 0.9 % bolus 1,000 mL (0 mLs Intravenous Stopped 01/24/18 1041)  EPINEPHrine (EPI-PEN) injection 0.3 mg (0.3 mg Intramuscular Given 01/24/18 0936)  methylPREDNISolone sodium succinate (SOLU-MEDROL) 125 mg/2 mL injection 125 mg (125 mg Intravenous Given 01/24/18 0939)  famotidine (PEPCID) IVPB 20 mg premix (0 mg Intravenous Stopped 01/24/18 1012)  diphenhydrAMINE (BENADRYL) injection 25 mg (50 mg Intravenous Given 01/24/18 0938)  fentaNYL (SUBLIMAZE) injection 50 mcg (50 mcg Intravenous Given 01/24/18 0939)  ipratropium-albuterol (DUONEB) 0.5-2.5 (3) MG/3ML nebulizer solution 3 mL (3 mLs Nebulization Given 01/24/18 0943)  morphine 4 MG/ML injection 4 mg (4 mg Intravenous Given 01/24/18 1151)  ondansetron (ZOFRAN) injection 4 mg (4 mg Intravenous Given 01/24/18 1416)  morphine 4 MG/ML injection 4 mg (4 mg Intravenous Given 01/24/18 1417)     Initial Impression / Assessment and Plan / ED Course  I have reviewed the triage vital signs and the nursing notes.  Pertinent labs & imaging results that were available during my care  of the patient were reviewed by me and considered in my medical decision making (see chart for details).     Patient presents with allergic reaction after multiple bee stings.  He responded well to subcu epinephrine, IV steroids, IV Benadryl, IV Pepcid, IV fluids.  Additionally, he has a compression fracture of T11 and T12 [each approximately 15%].  Pain management in the ED.  No additional neurological deficits.  These findings were discussed with the orthopedic surgeon on-call Dr. Dorene Grebe.  We will provide a TLSO brace.  Discharge medications Percocet and Flexeril 10 mg.  Patient will follow-up with orthopedics this week.  These findings were discussed with the patient and his wife in great detail.   CRITICAL CARE Performed by: Donnetta Hutching Total critical care time: 50 minutes Critical care time was exclusive of separately billable procedures and treating other patients. Critical care was necessary to treat or prevent imminent or life-threatening deterioration. Critical care was time spent personally by me on the following activities: development of treatment plan with patient and/or surrogate as well as nursing, discussions with consultants, evaluation of patient's response to treatment, examination of patient, obtaining history from patient or surrogate, ordering and performing treatments and interventions, ordering and review of laboratory studies, ordering and review of radiographic studies, pulse oximetry and re-evaluation of patient's condition.  Final Clinical Impressions(s) / ED Diagnoses   Final diagnoses:  Allergic reaction, initial encounter  Fall, initial encounter  Closed wedge compression fracture of eleventh thoracic vertebra, initial encounter (HCC)  Closed wedge compression fracture of twelfth thoracic vertebra, initial encounter [facility])    ED Discharge Orders         Ordered    oxyCODONE-acetaminophen (PERCOCET) 5-325 MG tablet  Every 4 hours PRN     01/24/18 1455     cyclobenzaprine (FLEXERIL) 10 MG tablet  2 times daily PRN     01/24/18 1455           Donnetta Hutching, MD 01/24/18 1503

## 2018-01-24 NOTE — Discharge Instructions (Signed)
You have a compression fracture of thoracic 11 and thoracic 12 vertebrae.  Back brace.  If you need crutches, you can get those from the pharmacy.  Prescription for pain medicine and muscle relaxer.  Follow-up with orthopedics next week.  Phone number given.  Avoid total bedrest.  You will be in pain for several weeks.  Make sure you take your diabetes medicine.

## 2018-01-24 NOTE — ED Notes (Signed)
Greg from [employer] called for TLSO

## 2018-01-26 DIAGNOSIS — M545 Low back pain: Secondary | ICD-10-CM | POA: Diagnosis not present

## 2018-02-23 DIAGNOSIS — S22009D Unspecified fracture of unspecified thoracic vertebra, subsequent encounter for fracture with routine healing: Secondary | ICD-10-CM | POA: Diagnosis not present

## 2018-03-11 ENCOUNTER — Encounter (HOSPITAL_COMMUNITY): Payer: Self-pay | Admitting: Genetic Counselor

## 2018-03-11 ENCOUNTER — Other Ambulatory Visit (HOSPITAL_COMMUNITY): Payer: 59

## 2018-03-11 ENCOUNTER — Ambulatory Visit (HOSPITAL_BASED_OUTPATIENT_CLINIC_OR_DEPARTMENT_OTHER): Payer: 59 | Admitting: Genetic Counselor

## 2018-03-11 DIAGNOSIS — Z808 Family history of malignant neoplasm of other organs or systems: Secondary | ICD-10-CM | POA: Insufficient documentation

## 2018-03-11 DIAGNOSIS — Z809 Family history of malignant neoplasm, unspecified: Secondary | ICD-10-CM | POA: Diagnosis not present

## 2018-03-11 DIAGNOSIS — Z803 Family history of malignant neoplasm of breast: Secondary | ICD-10-CM | POA: Diagnosis not present

## 2018-03-11 DIAGNOSIS — Z800 Family history of malignant neoplasm of digestive organs: Secondary | ICD-10-CM

## 2018-03-11 DIAGNOSIS — Z8049 Family history of malignant neoplasm of other genital organs: Secondary | ICD-10-CM

## 2018-03-11 NOTE — Progress Notes (Signed)
REFERRING PROVIDER: Sharilyn Sites, [address] [place], [place] [address]  PRIMARY PROVIDER:  Sharilyn Sites, MD  PRIMARY REASON FOR VISIT:  1. Family history of melanoma   2. Family history of uterine cancer   3. Family history of stomach cancer      HISTORY OF PRESENT ILLNESS:   [name], a 42 y.o. male, was seen for a [place] cancer genetics consultation at the request of Dr. Hilma Favors due to a family history of cancer.  [name] presents to clinic today to discuss the possibility of a hereditary predisposition to cancer, genetic testing, and to further clarify his future cancer risks, as well as potential cancer risks for family members.   [name] is a 42 y.o. male with no personal history of cancer.  [name] mother recently underwent genetic testing for hereditary cancer syndromes.  She was found to carry a hereditary heterozygous mutation in MUTYH.  This gene is a recessive polyposis gene.  Since Ms. Cameron Lee has one mutation, she is a carrier for this condition. [name] is here to learn more about the condition and testing.  CANCER HISTORY:   No history exists.     Past Medical History:  Diagnosis Date  . Diabetes mellitus type II   . Family history of melanoma   . Family history of melanoma   . Family history of stomach cancer   . Family history of uterine cancer   . Hyperlipidemia    TG 839  . Leg fracture    Left, age 72    Past Surgical History:  Procedure Laterality Date  . APPENDECTOMY    . TESTICLE TORSION REDUCTION     As a child    Social History   Socioeconomic History  . Marital status: Married    Spouse name: Not on file  . Number of children: Not on file  . Years of education: Not on file  . Highest education level: Not on file  Occupational History  . Occupation: Full time, Teacher, music: [employer]  Social Needs  . Financial resource strain: Not on file  . Food insecurity:    Worry: Not on file     Inability: Not on file  . Transportation needs:    Medical: Not on file    Non-medical: Not on file  Tobacco Use  . Smoking status: Never Smoker  . Smokeless tobacco: Never Used  Substance and Sexual Activity  . Alcohol use: Yes    Comment: occasional  . Drug use: No  . Sexual activity: Not on file  Lifestyle  . Physical activity:    Days per week: Not on file    Minutes per session: Not on file  . Stress: Not on file  Relationships  . Social connections:    Talks on phone: Not on file    Gets together: Not on file    Attends religious service: Not on file    Active member of club or organization: Not on file    Attends meetings of clubs or organizations: Not on file    Relationship status: Not on file  Other Topics Concern  . Not on file  Social History Narrative   Married   No regular exercise     FAMILY HISTORY:  We obtained a detailed, 4-generation family history.  Significant diagnoses are listed below: Family History  Problem Relation Age of Onset  . Heart attack Paternal Uncle   . Heart disease Father  45s  . Breast cancer Mother 86       triple negative  . Lung cancer Maternal Aunt   . Melanoma Maternal Grandmother        dx in her 47s  . Melanoma Maternal Aunt        dx in her 45s  . Breast cancer Maternal Aunt 68       ER+  . Uterine cancer Cousin 61  . Breast cancer Other 7       MGMs sister  . Stomach cancer Other        MGMs brother    The patient has two sons who are cancer free.  He is an only child to his parents.  Both parents are living.  The patient's mother had triple negative breast cancer at age 87.  She has five sisters and one brother.  One sister had lung cancer, and has a daughter with uterine cancer at 4; one sister had melanoma in her 64's, one had breast cancer at 67 and one died of the flu.  The maternal grandparents are deceased.  The grandfather died of non cancer related issues.  The grandmother had melanoma in her  40's.  She had a sister who had breast cancer at 75 and a brother who had stomach cancer.    The patient's father has heart disease.  He has three brothers and a sister.  One brother died of a heart attack at 63.  The reminder of siblings are cancer free.  The paternal grandparents are deceased .   The grandfather had lung cancer.  Mr. [name] is aware of previous family history of genetic testing for hereditary cancer risks, specifically his mother who has an MUTYH mutation. Patient's maternal ancestors are of Caucasian descent, and paternal ancestors are of Caucasian descent. There is no reported Ashkenazi Jewish ancestry. There is no known consanguinity.  GENETIC COUNSELING ASSESSMENT: Cameron Lee is a 41 y.o. 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 We, therefore, discussed and recommended the following at today's visit.   DISCUSSION: We discussed that hereditary cancer syndromes are typically rare.  About 5-10% of breast cancer and melanoma, 5-7% of colon cancer, and 3-5% of uterine cancer is hereditary.  Mr. Bridge mother was identified with an MUTYH heterozygous mutation.  MUTYH associated polyposis (MAP) is an autosomal recessive condition that significantly increases the risk for hereditary colon cancer.  [name] has a 50% chance of inheriting his mother's mutation.  If he inherited this mutation, then we would be concerned whether he could have inherited a pathogenic mutation in MUTYH from his father., and therefore be at risk for colon cancer.  Genetic testing for the one mutation in his mother would be complementary based on the genetic testing companies testing policy.  However, we would need to test for other mutations that could have potentially been inherited from his father.    We reviewed the characteristics, features and inheritance patterns of hereditary cancer  syndromes. We also discussed genetic testing, including the appropriate family members to test, the process of testing, insurance coverage and turn-around-time for results. We discussed the implications of a negative, positive and/or variant of uncertain significant result. We recommended Mr. Kenley pursue genetic testing for the multi cancer gene panel. The Multi-Gene Panel offered by [facility] includes sequencing and/or deletion duplication testing of the following 84 genes: AIP, ALK, APC,  ATM, AXIN2,BAP1,  BARD1, BLM, BMPR1A, BRCA1, BRCA2, BRIP1, CASR, CDC73, CDH1, CDK4, CDKN1B, CDKN1C, CDKN2A (p14ARF), CDKN2A (p16INK4a), CEBPA, CHEK2, CTNNA1, DICER1, DIS3L2, EGFR (c.2369C>T, p.Thr790Met variant only), EPCAM (Deletion/duplication testing only), FH, FLCN, GATA2, GPC3, GREM1 (Promoter region deletion/duplication testing only), HOXB13 (c.251G>A, p.Gly84Glu), HRAS, KIT, MAX, MEN1, MET, MITF (c.952G>A, p.Glu318Lys variant only), MLH1, MSH2, MSH3, MSH6, MUTYH, NBN, NF1, NF2, NTHL1, PALB2, PDGFRA, PHOX2B, PMS2, POLD1, POLE, POT1, PRKAR1A, PTCH1, PTEN, RAD50, RAD51C, RAD51D, RB1, RECQL4, RET, RUNX1, SDHAF2, SDHA (sequence changes only), SDHB, SDHC, SDHD, SMAD4, SMARCA4, SMARCB1, SMARCE1, STK11, SUFU, TERC, TERT, TMEM127, TP53, TSC1, TSC2, VHL, WRN and WT1.    Based on Mr. Valade's family history of cancer, he meets medical criteria for genetic testing. Despite that he meets criteria, he may still have an out of pocket cost. We discussed that if his out of pocket cost for testing is over $100, the laboratory will call and confirm whether he wants to proceed with testing.  If the out of pocket cost of testing is less than $100 he will be billed by the genetic testing laboratory.   PLAN: After considering the risks, benefits, and limitations, Mr. Hanrahan  provided informed consent to pursue genetic testing and the blood sample was sent to [facility] for analysis of the Multi-cancer panel. Results should be available  within approximately 2-3 weeks' time, at which point they will be disclosed by telephone to Mr. Baba, as will any additional recommendations warranted by these results. Mr. Bovey will receive a summary of his genetic counseling visit and a copy of his results once available. This information will also be available in Epic. We encouraged Mr. Mattioli to remain in contact with cancer genetics annually so that we can continuously update the family history and inform him of any changes in cancer genetics and testing that may be of benefit for his family. Mr. Tienda questions were answered to his satisfaction today. Our contact information was provided should additional questions or concerns arise.  Lastly, we encouraged Mr. Stults to remain in contact with cancer genetics annually so that we can continuously update the family history and inform him of any changes in cancer genetics and testing that may be of benefit for this family.   Mr.  Peixoto questions were answered to his satisfaction today. Our contact information was provided should additional questions or concerns arise. Thank you for the referral and allowing [place] to share in the care of your patient.   Verniece Encarnacion P. Florene Glen, [place], [facility] Certified Genetic Counselor [email or web address] phone: [phone]  The patient was seen for a total of 35 minutes in face-to-face genetic counseling.  This patient was discussed with Drs. Magrinat, Lindi Adie and/or Burr Medico who agrees with the above.    _______________________________________________________________________ For Office Staff:  Number of people involved in session: 6 Was an Intern/ student involved with case: no

## 2018-03-22 ENCOUNTER — Telehealth: Payer: Self-pay | Admitting: Genetic Counselor

## 2018-03-22 ENCOUNTER — Encounter: Payer: Self-pay | Admitting: Genetic Counselor

## 2018-03-22 DIAGNOSIS — Z1379 Encounter for other screening for genetic and chromosomal anomalies: Secondary | ICD-10-CM | POA: Insufficient documentation

## 2018-03-22 NOTE — Telephone Encounter (Signed)
LM on VM that results are back and to please CB.  Left CB instructions. 

## 2018-03-23 DIAGNOSIS — S22009D Unspecified fracture of unspecified thoracic vertebra, subsequent encounter for fracture with routine healing: Secondary | ICD-10-CM | POA: Diagnosis not present

## 2018-03-23 NOTE — Telephone Encounter (Signed)
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  Based on family history we would not change medical management.  I released a copy of his test results to him.

## 2018-03-29 ENCOUNTER — Ambulatory Visit: Payer: Self-pay | Admitting: Genetic Counselor

## 2018-03-29 DIAGNOSIS — Z1379 Encounter for other screening for genetic and chromosomal anomalies: Secondary | ICD-10-CM

## 2018-03-29 NOTE — Progress Notes (Addendum)
HPI:  [name] was previously seen in the [facility] due to a family history of cancer and concerns regarding a hereditary predisposition to cancer. Please refer to our prior cancer genetics clinic note for more information regarding [name]'s medical, social and family histories, and our assessment and recommendations, at the time. [name] recent genetic test results were disclosed to him, as were recommendations warranted by these results. These results and recommendations are discussed in more detail below.  CANCER HISTORY:   No history exists.    FAMILY HISTORY:  We obtained a detailed, 4-generation family history.  Significant diagnoses are listed below: Family History  Problem Relation Age of Onset  . Heart attack Paternal Uncle   . Heart disease Father        97s  . Breast cancer Mother 33       triple negative  . Lung cancer Maternal Aunt   . Melanoma Maternal Grandmother        dx in her 59s  . Melanoma Maternal Aunt        dx in her 28s  . Breast cancer Maternal Aunt 68       ER+  . Uterine cancer Cousin 35  . Breast cancer Other 84       MGMs sister  . Stomach cancer Other        MGMs brother    The patient has two sons who are cancer free.  He is an only child to his parents.  Both parents are living.  The patient's mother had triple negative breast cancer at age 33.  She has five sisters and one brother.  One sister had lung cancer, and has a daughter with uterine cancer at 48; one sister had melanoma in her 42's, one had breast cancer at 28 and one died of the flu.  The maternal grandparents are deceased.  The grandfather died of non cancer related issues.  The grandmother had melanoma in her 7's.  She had a sister who had breast cancer at 102 and a brother who had stomach cancer.    The patient's father has heart disease.  He has three brothers and a sister.  One brother died of a heart attack at 7.  The reminder of siblings are cancer  free.  The paternal grandparents are deceased .   The grandfather had lung cancer.  [name] is aware of previous family history of genetic testing for hereditary cancer risks, specifically his mother who has an MUTYH mutation. Patient's maternal ancestors are of Caucasian descent, and paternal ancestors are of Caucasian descent. There is no reported Ashkenazi Jewish ancestry. There is no known consanguinity.  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   We reviewed recessive inheritance and discussed when an individual has two MYH gene mutations, this is associated with an increased risk for adenomatous (precancerous) colon polyps. Recently, the [employer] (NCCN: V.2.2019) provided national guidelines to manage the colon cancer risk found with single (heterozygous) MUTYH mutation.  These guidelines include having a MUTYH pathogenic mutation and:  Personal history of colon cancer  Follow instructions provided by your physician based on your personal history.  Do not have a personal history of colon cancer but have a parent/sibling/child with colon cancer: Colonoscopy every 5 years starting at age 16  or 10 years younger than the earliest age of onset, whichever is younger.  Do not have a personal history of colon cancer and do not have a parent/sibling/child with colon cancer: Data is uncertain whether specialized screening is needed.  However, to our current knowledge, individuals with only one MYH gene mutation do not have an increased risk for colon polyps.    CANCER SCREENING RECOMMENDATIONS:  This result is reassuring and indicates that Cameron Lee does not likely have an increased risk of cancer due to a mutation in one of these genes.  We, therefore, recommended  Cameron Lee continue to  follow the cancer screening guidelines provided by his primary healthcare providers.   An individual's cancer risk and medical management are not determined by genetic test results alone. Overall cancer risk assessment incorporates additional factors, including personal medical history, family history, and any available genetic information that may result in a personalized plan for cancer prevention and surveillance.  RECOMMENDATIONS FOR FAMILY MEMBERS:  Individuals in this family might be at some increased risk of developing cancer, over the general population risk, simply due to the family history of cancer.  We recommend that family members undergo genetic testing for MUTYH mutations - not just this one mutation, but for any mutations, since this is a recessive condition.    We recommended women in this family have a yearly mammogram beginning at age 41, or 7010 years younger than the earliest onset of cancer, an annual clinical breast exam, and perform monthly breast self-exams. Women in this family should also have a gynecological exam as recommended by their primary provider. All family members should have a colonoscopy by age 41.  Based on Mr. Signore's family history, we recommended his sons have genetic counseling and testing once they are 118.  We do not test children, or individuals under the age of 41, for mutations within these genes. Cameron Lee will let us know if we can be of any assistance in coordinating genetic counseling and/or testing for this family member.   FOLLOW-UP: Lastly, we discussed with Cameron Lee that cancer genetics is a rapidly advancing field and it is possible that new genetic tests will be appropriate for him and/or his family members in the future. We encouraged him to remain in contact with cancer genetics on an annual basis so we can update his personal and family histories and let him know of advances in cancer genetics that may benefit this family.   Our contact number  was provided. Cameron Lee's questions were answered to his satisfaction, and he knows he is welcome to call us at anytime with additional questions or concerns.   Cameron Lee Cameron Maryum Batterson, MS, [facility] Certified Genetic Counselor [email or web address]

## 2018-04-19 DIAGNOSIS — S22000D Wedge compression fracture of unspecified thoracic vertebra, subsequent encounter for fracture with routine healing: Secondary | ICD-10-CM | POA: Diagnosis not present

## 2018-05-27 DIAGNOSIS — K760 Fatty (change of) liver, not elsewhere classified: Secondary | ICD-10-CM | POA: Diagnosis not present

## 2018-05-27 DIAGNOSIS — Z1389 Encounter for screening for other disorder: Secondary | ICD-10-CM | POA: Diagnosis not present

## 2018-05-27 DIAGNOSIS — E7849 Other hyperlipidemia: Secondary | ICD-10-CM | POA: Diagnosis not present

## 2018-05-27 DIAGNOSIS — E119 Type 2 diabetes mellitus without complications: Secondary | ICD-10-CM | POA: Diagnosis not present

## 2018-06-23 ENCOUNTER — Encounter: Payer: Self-pay | Admitting: Cardiovascular Disease

## 2018-06-23 ENCOUNTER — Ambulatory Visit: Payer: 59 | Admitting: Cardiovascular Disease

## 2018-06-23 VITALS — BP 120/86 | HR 94 | Ht 69.0 in | Wt 208.4 lb

## 2018-06-23 DIAGNOSIS — E119 Type 2 diabetes mellitus without complications: Secondary | ICD-10-CM

## 2018-06-23 DIAGNOSIS — E785 Hyperlipidemia, unspecified: Secondary | ICD-10-CM

## 2018-06-23 DIAGNOSIS — R079 Chest pain, unspecified: Secondary | ICD-10-CM | POA: Diagnosis not present

## 2018-06-23 NOTE — Patient Instructions (Signed)
Medication Instructions:  Your physician recommends that you continue on your current medications as directed. Please refer to the Current Medication list given to you today.   Labwork: NONE  Testing/Procedures: CORONARY CALCIUM SCORING   Follow-Up: Your physician wants you to follow-up in: 6 MONTHS.  You will receive a reminder letter in the mail two months in advance. If you don't receive a letter, please call our office to schedule the follow-up appointment.  Any Other Special Instructions Will Be Listed Below (If Applicable).     If you need a refill on your cardiac medications before your next appointment, please call your pharmacy.

## 2018-06-23 NOTE — Progress Notes (Signed)
CARDIOLOGY CONSULT NOTE  Patient ID: Cameron Lee MRN: [id number] DOB/AGE: 05-23-1975 43 y.o.  Admit date: (Not on file) Primary Physician: Assunta Found, MD Referring Physician: Assunta Found, MD  Reason for Consultation: Cardiac risk stratification  HPI: Cameron Lee is a 43 y.o. male who is being seen today for the evaluation of cardiac risk stratification at the request of Assunta Found, MD.   Past medical history includes type 2 diabetes mellitus and hyperlipidemia.  I reviewed labs dated 05/27/2018: BUN 18, creatinine 0.98, sodium 137, potassium 4.4, normal liver transaminases, total cholesterol 220, triglycerides 303, HDL 44, LDL 115, A1c 7.4%.  I personally reviewed the ECG performed on 01/24/2018 which demonstrated sinus rhythm with nonspecific T wave abnormalities.  CT of the thoracic spine on 01/24/2018 did not mention any evidence of coronary artery calcifications.  He sustained a compression fracture of T11/T12 in September 2019.  He had been more active prior to that but is gradually regaining his strength and said he is doing much better from the standpoint.  He denies exertional chest pain and shortness of breath.  He has some occasional nausea.  He said energy levels have been stable over the past 6 to 12 months.  He denies orthopnea, leg swelling, palpitations, dizziness, and syncope.  Family history: His father, Reita Cliche, is also my patient and underwent CABG in late 2019.   Allergies  Allergen Reactions  . Bee Venom Swelling  . Penicillins     Has patient had a PCN reaction causing immediate rash, facial/tongue/throat swelling, SOB or lightheadedness with hypotension: No Has patient had a PCN reaction causing severe rash involving mucus membranes or skin necrosis: No Has patient had a PCN reaction that required hospitalization: No Has patient had a PCN reaction occurring within the last 10 years: No If all of the above answers are "NO", then may proceed  with Cephalosporin use.      Current Outpatient Medications  Medication Sig Dispense Refill  . atorvastatin (LIPITOR) 20 MG tablet Take 1 tablet by mouth daily.    Marland Kitchen escitalopram (LEXAPRO) 20 MG tablet Take 1 tablet by mouth daily.    Marland Kitchen JANUVIA 100 MG tablet Take 1 tablet by mouth daily.    Marland Kitchen JARDIANCE 25 MG TABS tablet Take 1 tablet by mouth daily.    Marland Kitchen loratadine (CLARITIN) 10 MG tablet Take 10 mg by mouth daily.      . metFORMIN (GLUCOPHAGE) 1000 MG tablet Take 1 tablet by mouth 2 (two) times daily.     No current facility-administered medications for this visit.     Past Medical History:  Diagnosis Date  . Diabetes mellitus type II   . Family history of melanoma   . Family history of melanoma   . Family history of stomach cancer   . Family history of uterine cancer   . Hyperlipidemia    TG 839  . Leg fracture    Left, age 73    Past Surgical History:  Procedure Laterality Date  . APPENDECTOMY    . TESTICLE TORSION REDUCTION     As a child    Social History   Socioeconomic History  . Marital status: Married    Spouse name: Not on file  . Number of children: Not on file  . Years of education: Not on file  . Highest education level: Not on file  Occupational History  . Occupation: Full time, Freight forwarder: [employer]  Social Needs  . Financial resource strain: Not on file  . Food insecurity:    Worry: Not on file    Inability: Not on file  . Transportation needs:    Medical: Not on file    Non-medical: Not on file  Tobacco Use  . Smoking status: Never Smoker  . Smokeless tobacco: Never Used  Substance and Sexual Activity  . Alcohol use: Yes    Comment: occasional  . Drug use: No  . Sexual activity: Not on file  Lifestyle  . Physical activity:    Days per week: Not on file    Minutes per session: Not on file  . Stress: Not on file  Relationships  . Social connections:    Talks on phone: Not on file    Gets together: Not on file     Attends religious service: Not on file    Active member of club or organization: Not on file    Attends meetings of clubs or organizations: Not on file    Relationship status: Not on file  . Intimate partner violence:    Fear of current or ex partner: Not on file    Emotionally abused: Not on file    Physically abused: Not on file    Forced sexual activity: Not on file  Other Topics Concern  . Not on file  Social History Narrative   Married   No regular exercise      Current Meds  Medication Sig  . atorvastatin (LIPITOR) 20 MG tablet Take 1 tablet by mouth daily.  Marland Kitchen escitalopram (LEXAPRO) 20 MG tablet Take 1 tablet by mouth daily.  Marland Kitchen JANUVIA 100 MG tablet Take 1 tablet by mouth daily.  Marland Kitchen JARDIANCE 25 MG TABS tablet Take 1 tablet by mouth daily.  Marland Kitchen loratadine (CLARITIN) 10 MG tablet Take 10 mg by mouth daily.    . metFORMIN (GLUCOPHAGE) 1000 MG tablet Take 1 tablet by mouth 2 (two) times daily.      Review of systems complete and found to be negative unless listed above in HPI    Physical exam Blood pressure 122/90, pulse 88, height  (1.753 m), weight 208 lb 6.4 oz (94.5 kg), SpO2 98 %. General: NAD Neck: No JVD, no thyromegaly or thyroid nodule.  Lungs: Clear to auscultation bilaterally with normal respiratory effort. CV: Nondisplaced PMI. Regular rate and rhythm, normal S1/S2, no S3/S4, no murmur.  No peripheral edema.  No carotid bruit.    Abdomen: Soft, nontender, no distention.  Skin: Intact without lesions or rashes.  Neurologic: Alert and oriented x 3.  Psych: Normal affect. Extremities: No clubbing or cyanosis.  HEENT: Normal.   ECG: Most recent ECG reviewed.   Labs: Lab Results  Component Value Date/Time   K 4.1 01/24/2018 09:31 AM   BUN 14 01/24/2018 09:31 AM   CREATININE 1.03 01/24/2018 09:31 AM   HGB 16.0 01/24/2018 09:31 AM     Lipids: No results found for: LDLCALC, LDLDIRECT, CHOL, TRIG, HDL      ASSESSMENT AND PLAN:  1.  Cardiac risk  stratification: He is essentially asymptomatic with only occasional chest pains lasting seconds and occurring at rest.  Risk factors include family history of heart disease, hypercholesterolemia, and type 2 diabetes mellitus.  In order to further re-stratify him, I will obtain a coronary artery calcium score.  Based upon this, I will decide about intensifying statin therapy and possibly the consideration of aspirin.  2.  Hyperlipidemia: Lipids reviewed above.  Currently on atorvastatin 20 mg.  I will decide about increasing this dose based upon coronary artery calcium score.  3.  Type 2 diabetes mellitus: A1c 7.4%.  Currently on metformin, Januvia, and Jardiance.  He is also on atorvastatin 20 mg.     Disposition: Follow up in 6 months  Signed: Prentice Docker, M.D., F.A.C.C.  06/23/2018, 8:27 AM

## 2018-07-02 ENCOUNTER — Other Ambulatory Visit: Payer: Self-pay | Admitting: Family Medicine

## 2018-07-02 ENCOUNTER — Other Ambulatory Visit (HOSPITAL_COMMUNITY): Payer: Self-pay | Admitting: Family Medicine

## 2018-07-02 DIAGNOSIS — N451 Epididymitis: Secondary | ICD-10-CM

## 2018-07-02 DIAGNOSIS — Z6830 Body mass index (BMI) 30.0-30.9, adult: Secondary | ICD-10-CM

## 2018-07-02 DIAGNOSIS — E6609 Other obesity due to excess calories: Secondary | ICD-10-CM

## 2018-07-02 DIAGNOSIS — Z1389 Encounter for screening for other disorder: Secondary | ICD-10-CM

## 2018-07-06 ENCOUNTER — Ambulatory Visit (HOSPITAL_COMMUNITY)
Admission: RE | Admit: 2018-07-06 | Discharge: 2018-07-06 | Disposition: A | Discharge status: Home / Self Care | Payer: 59 | Source: Ambulatory Visit | Attending: Family Medicine | Admitting: Family Medicine

## 2018-07-06 DIAGNOSIS — N451 Epididymitis: Secondary | ICD-10-CM | POA: Diagnosis present

## 2018-07-06 DIAGNOSIS — N433 Hydrocele, unspecified: Secondary | ICD-10-CM | POA: Diagnosis not present

## 2018-07-06 DIAGNOSIS — Z6830 Body mass index (BMI) 30.0-30.9, adult: Secondary | ICD-10-CM | POA: Insufficient documentation

## 2018-07-06 DIAGNOSIS — Z1389 Encounter for screening for other disorder: Secondary | ICD-10-CM | POA: Diagnosis present

## 2018-07-06 DIAGNOSIS — E6609 Other obesity due to excess calories: Secondary | ICD-10-CM | POA: Insufficient documentation

## 2018-07-22 ENCOUNTER — Other Ambulatory Visit: Payer: 59

## 2018-07-23 DIAGNOSIS — S233XXA Sprain of ligaments of thoracic spine, initial encounter: Secondary | ICD-10-CM | POA: Diagnosis not present

## 2018-07-23 DIAGNOSIS — M531 Cervicobrachial syndrome: Secondary | ICD-10-CM | POA: Diagnosis not present

## 2018-07-23 DIAGNOSIS — S134XXA Sprain of ligaments of cervical spine, initial encounter: Secondary | ICD-10-CM | POA: Diagnosis not present

## 2018-07-27 DIAGNOSIS — S233XXA Sprain of ligaments of thoracic spine, initial encounter: Secondary | ICD-10-CM | POA: Diagnosis not present

## 2018-07-27 DIAGNOSIS — S134XXA Sprain of ligaments of cervical spine, initial encounter: Secondary | ICD-10-CM | POA: Diagnosis not present

## 2018-07-27 DIAGNOSIS — M531 Cervicobrachial syndrome: Secondary | ICD-10-CM | POA: Diagnosis not present

## 2018-08-02 DIAGNOSIS — M531 Cervicobrachial syndrome: Secondary | ICD-10-CM | POA: Diagnosis not present

## 2018-08-02 DIAGNOSIS — S233XXA Sprain of ligaments of thoracic spine, initial encounter: Secondary | ICD-10-CM | POA: Diagnosis not present

## 2018-08-02 DIAGNOSIS — S134XXA Sprain of ligaments of cervical spine, initial encounter: Secondary | ICD-10-CM | POA: Diagnosis not present

## 2018-08-10 DIAGNOSIS — S233XXA Sprain of ligaments of thoracic spine, initial encounter: Secondary | ICD-10-CM | POA: Diagnosis not present

## 2018-08-10 DIAGNOSIS — S134XXA Sprain of ligaments of cervical spine, initial encounter: Secondary | ICD-10-CM | POA: Diagnosis not present

## 2018-08-10 DIAGNOSIS — M531 Cervicobrachial syndrome: Secondary | ICD-10-CM | POA: Diagnosis not present

## 2018-08-16 DIAGNOSIS — M531 Cervicobrachial syndrome: Secondary | ICD-10-CM | POA: Diagnosis not present

## 2018-08-16 DIAGNOSIS — S233XXA Sprain of ligaments of thoracic spine, initial encounter: Secondary | ICD-10-CM | POA: Diagnosis not present

## 2018-08-16 DIAGNOSIS — S134XXA Sprain of ligaments of cervical spine, initial encounter: Secondary | ICD-10-CM | POA: Diagnosis not present

## 2018-08-31 DIAGNOSIS — Z6830 Body mass index (BMI) 30.0-30.9, adult: Secondary | ICD-10-CM | POA: Diagnosis not present

## 2018-08-31 DIAGNOSIS — E6609 Other obesity due to excess calories: Secondary | ICD-10-CM | POA: Diagnosis not present

## 2018-08-31 DIAGNOSIS — E1165 Type 2 diabetes mellitus with hyperglycemia: Secondary | ICD-10-CM | POA: Diagnosis not present

## 2018-09-01 DIAGNOSIS — S134XXA Sprain of ligaments of cervical spine, initial encounter: Secondary | ICD-10-CM | POA: Diagnosis not present

## 2018-09-01 DIAGNOSIS — M531 Cervicobrachial syndrome: Secondary | ICD-10-CM | POA: Diagnosis not present

## 2018-09-01 DIAGNOSIS — S233XXA Sprain of ligaments of thoracic spine, initial encounter: Secondary | ICD-10-CM | POA: Diagnosis not present

## 2018-10-18 ENCOUNTER — Encounter: Payer: Self-pay | Admitting: *Deleted

## 2020-03-31 IMAGING — CT CT T SPINE W/O CM
3 of 4 series · 12 of 33 positions shown, 14 images · non-contrast
Comparison: None.

CLINICAL DATA: Status post fall.  Back pain.

EXAM:
CT THORACIC SPINE WITHOUT CONTRAST
TECHNIQUE: Multidetector CT images of the thoracic were obtained using the
standard protocol without intravenous contrast.

[Series 4: t spine soft · axial · 0.33mm/px · z∈[-269,-13]mm · 5 of 192 slices shown, 7 images]
[im 32/192  soft-tissue]
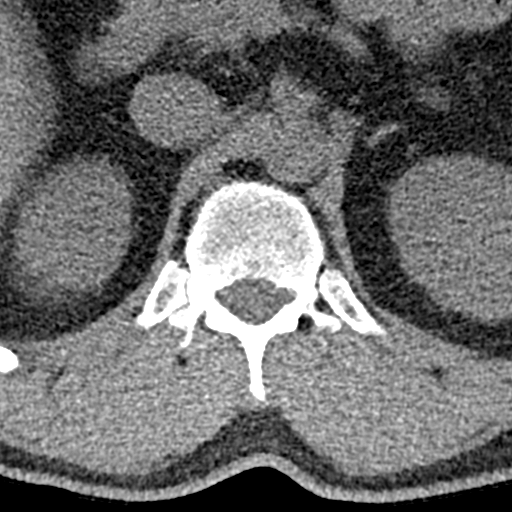
[im 32/192  bone]
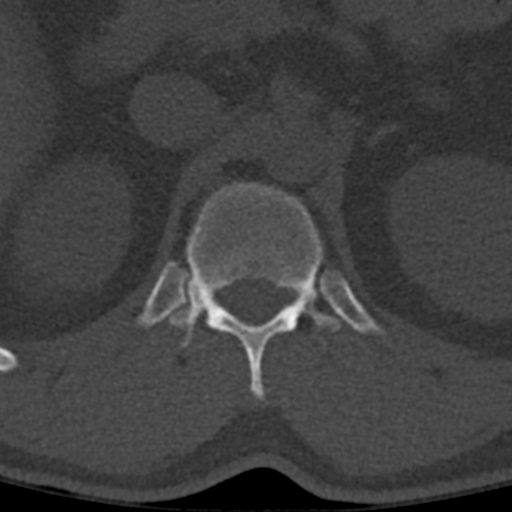
[im 64/192  bone]
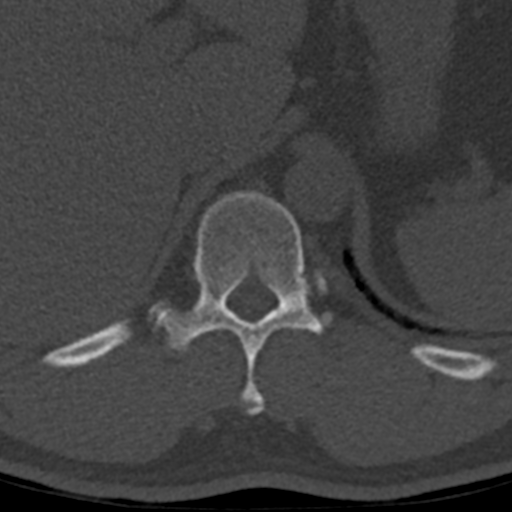
[im 96/192  bone]
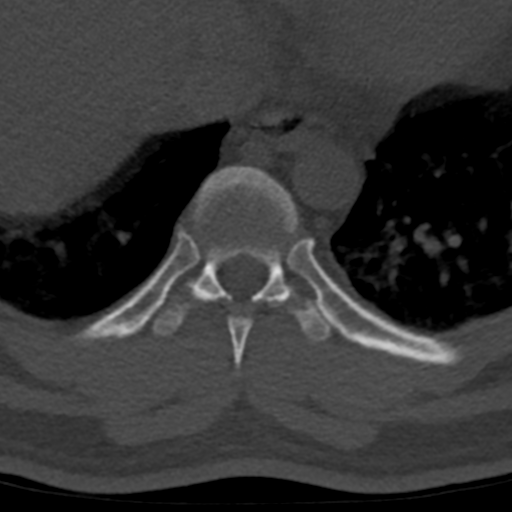
[im 128/192  bone]
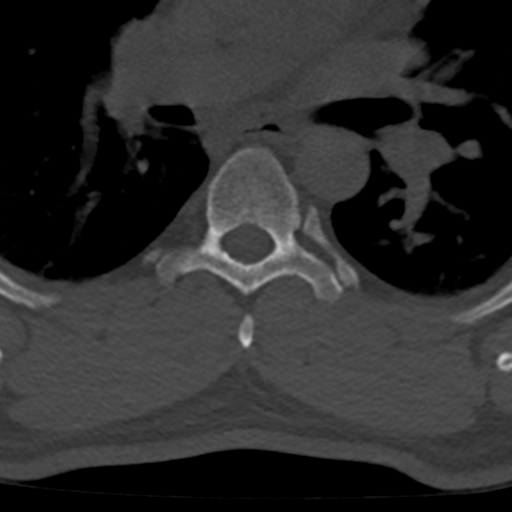
[im 160/192  soft-tissue]
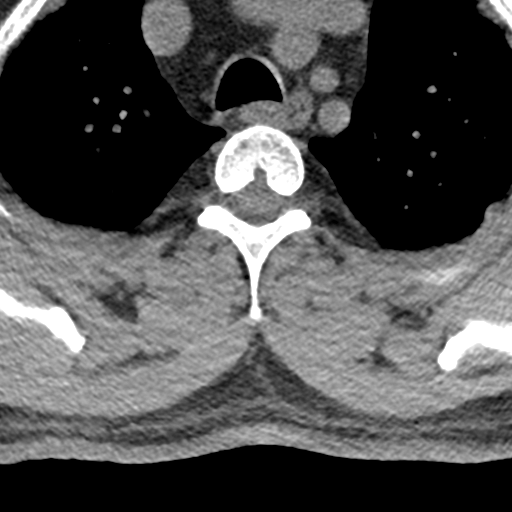
[im 160/192  bone]
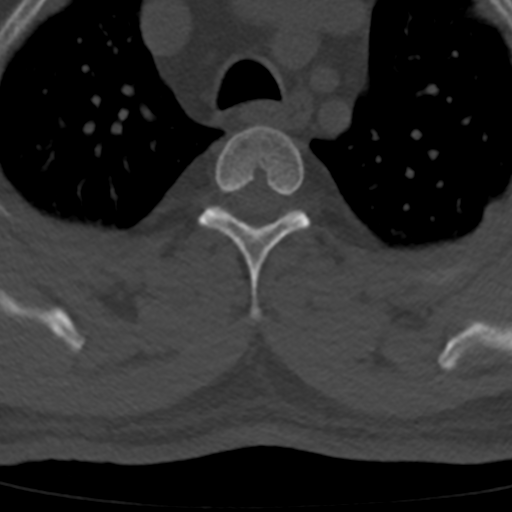

[Series 6: coronal bone · coronal · 0.27mm/px · 3 of 88 slices shown]
[im 18/88  bone]
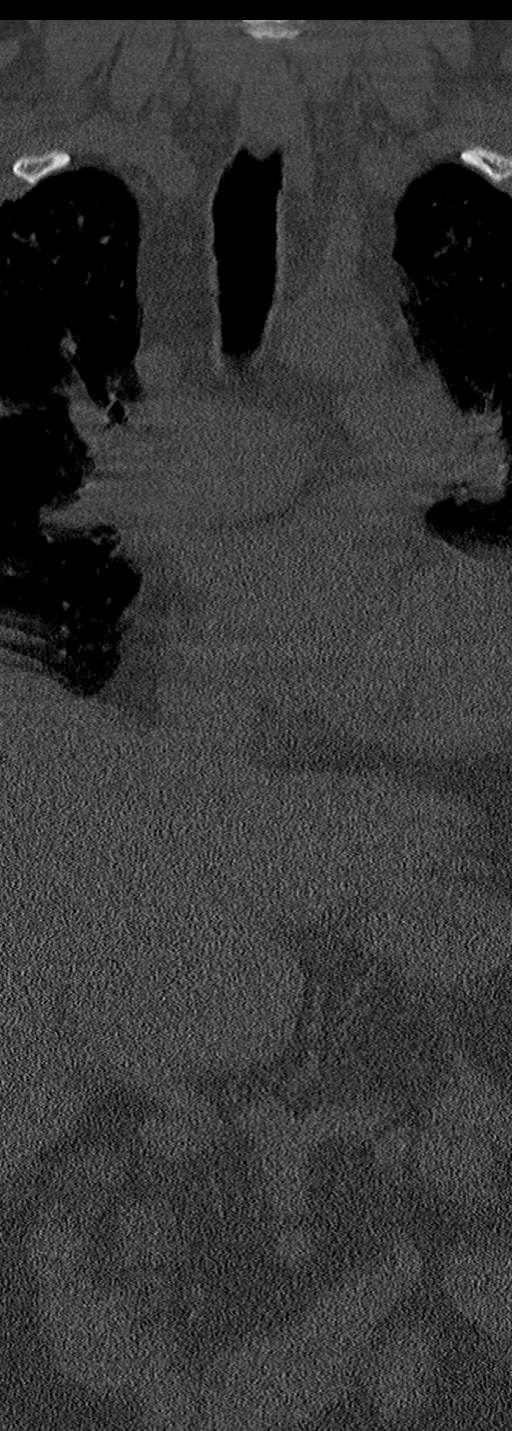
[im 35/88  bone]
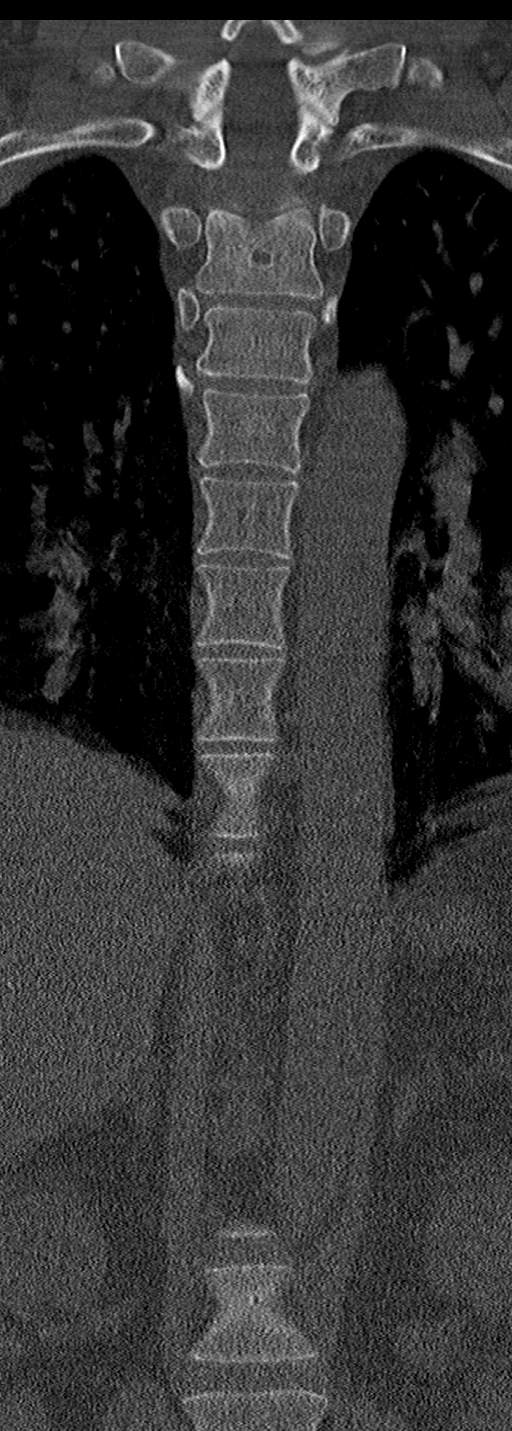
[im 53/88  bone]
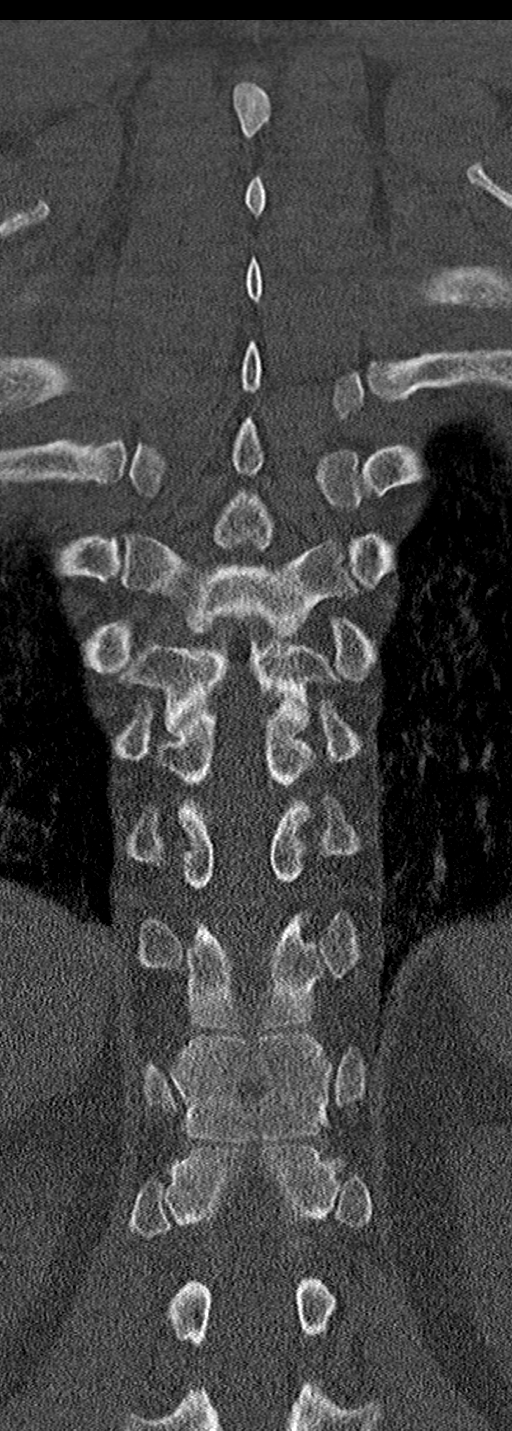

[Series 8: t spine soft true axials · axial · 0.21mm/px · z∈[-268,-79]mm · 4 of 196 slices shown]
[im 33/196  soft-tissue]
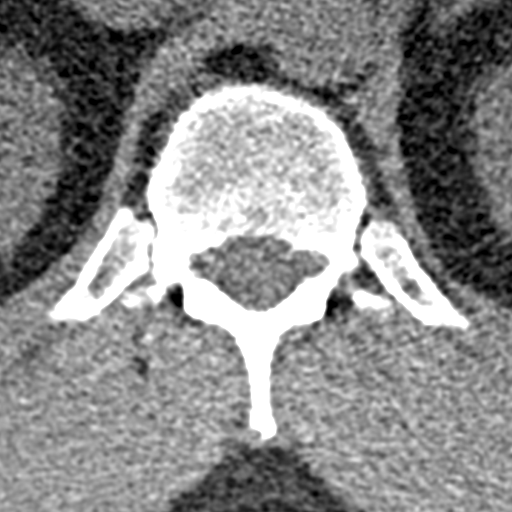
[im 66/196  soft-tissue]
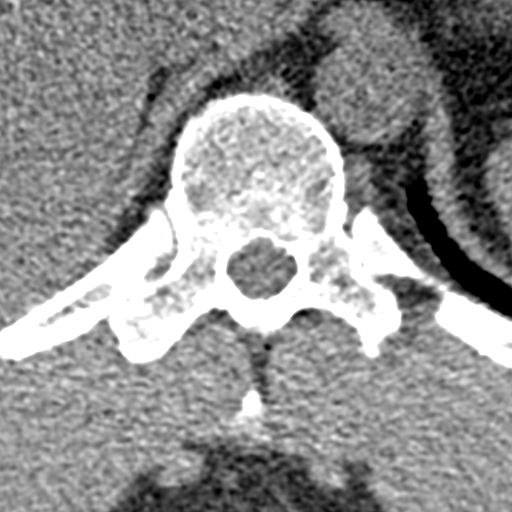
[im 98/196  soft-tissue]
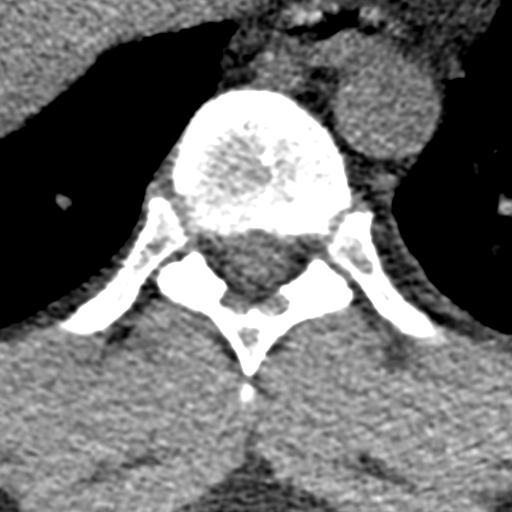
[im 131/196  soft-tissue]
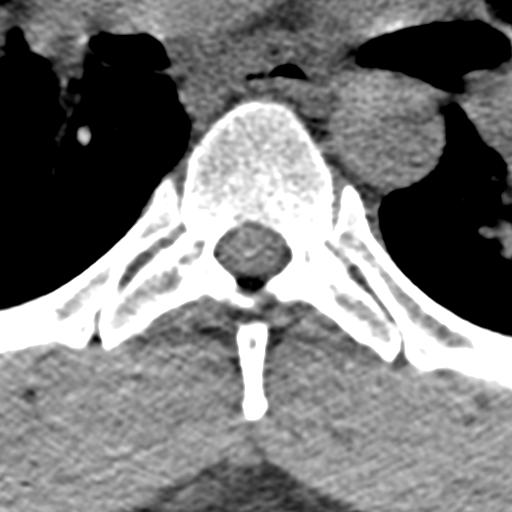

[12 of 33 positions shown; findings below may reference images not displayed]

FINDINGS: Alignment: Normal.

Vertebrae: No discitis or osteomyelitis. No aggressive osseous
lesion. Acute anterior compression fractures of the T11 and T12
vertebral bodies with approximately 15% height loss. Remainder the
vertebral body heights are maintained. Possible nondisplaced
fracture at the tip of the right L2 transverse process. No other
fracture.

Paraspinal and other soft tissues: No acute paraspinal abnormality.

Disc levels: Disc spaces are maintained.  No foraminal stenosis.
IMPRESSION: 1. Acute anterior compression fractures of the T11 and T12 vertebral
bodies with 15% height loss.

## 2020-05-11 IMAGING — US ULTRASOUND SCROTUM DOPPLER COMPLETE
1 series · 14 of 25 positions shown · non-contrast
Comparison: None.

CLINICAL DATA: Scrotal pain 2 weeks left greater than right. Rule
out epididymitis without abscess.

EXAM:
SCROTAL ULTRASOUND
DOPPLER ULTRASOUND OF THE TESTICLES
TECHNIQUE: Complete ultrasound examination of the testicles, epididymis, and
other scrotal structures was performed. Color and spectral Doppler
ultrasound were also utilized to evaluate blood flow to the
testicles.

[Series 1: ultrasound scrotum doppler complete · 0.06mm/px · 14 of 68 slices shown]
[im 1/68]
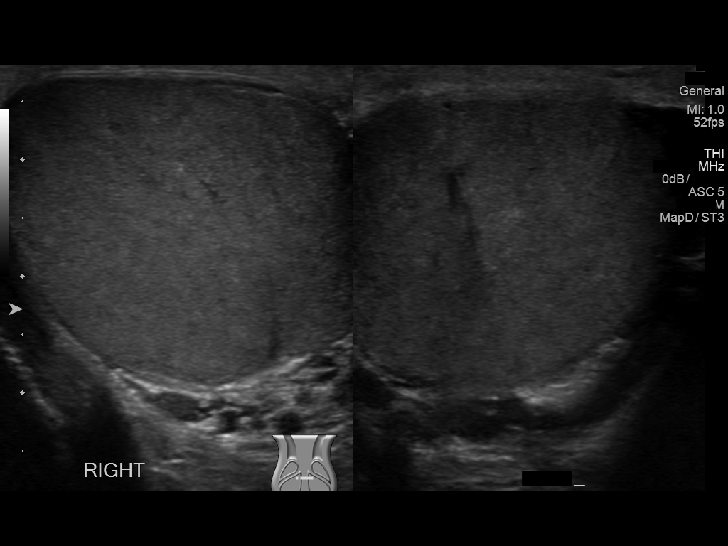
[im 6/68]
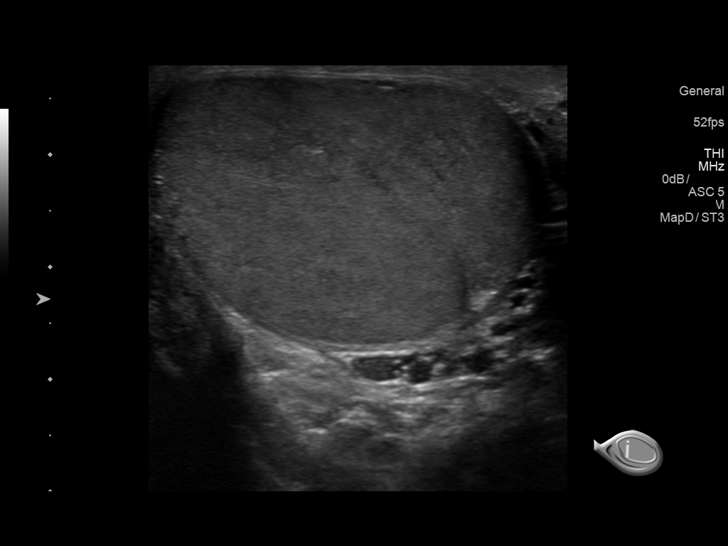
[im 12/68]
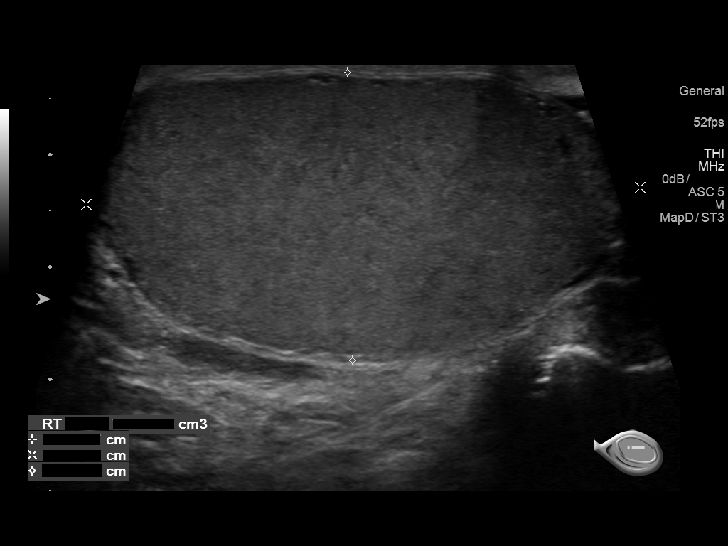
[im 17/68]
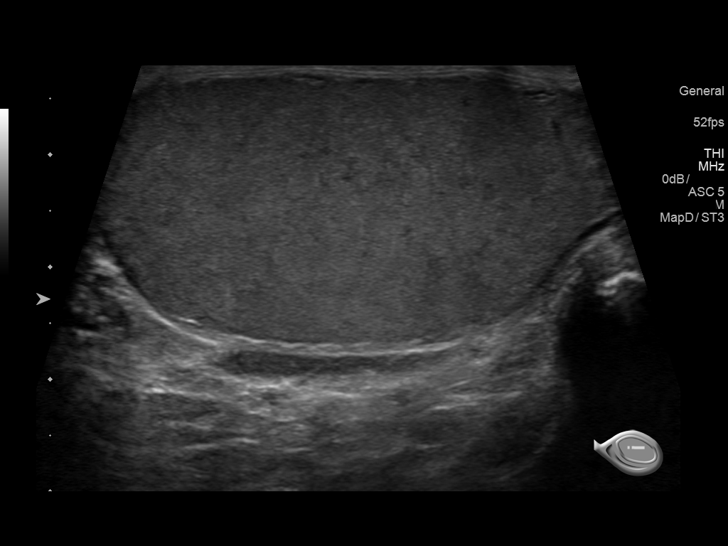
[im 23/68]
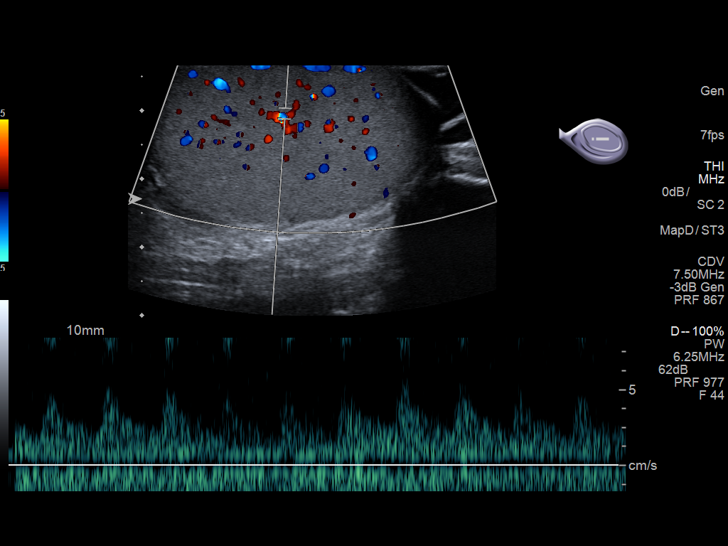
[im 26/68]
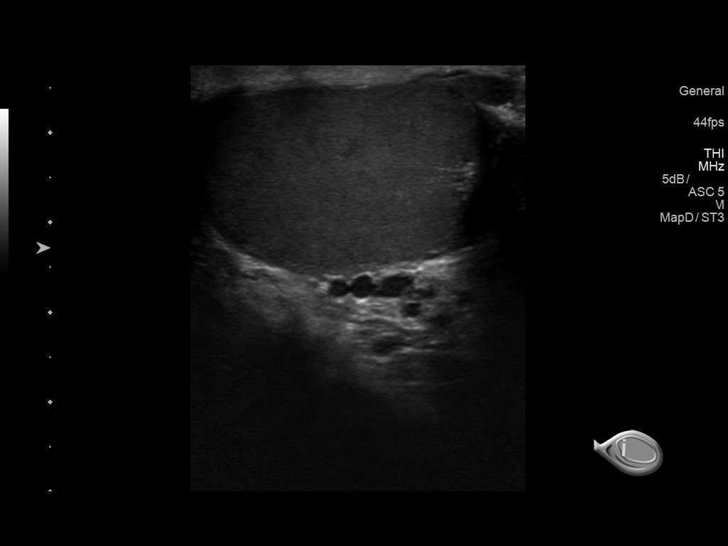
[im 31/68]
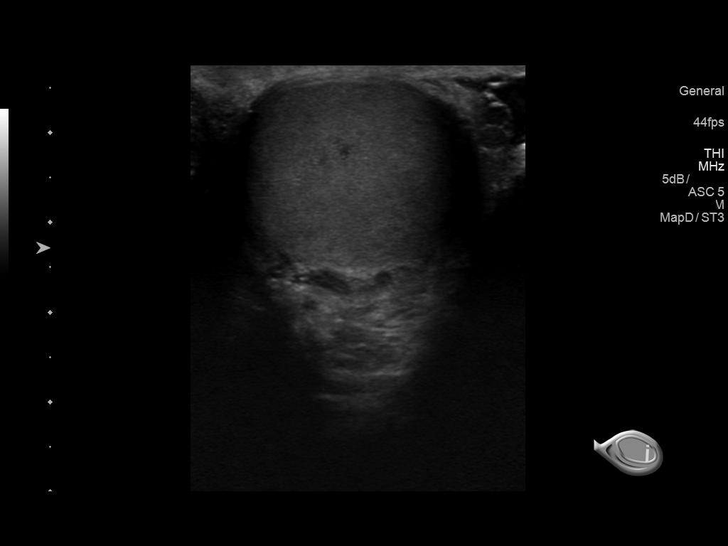
[im 37/68]
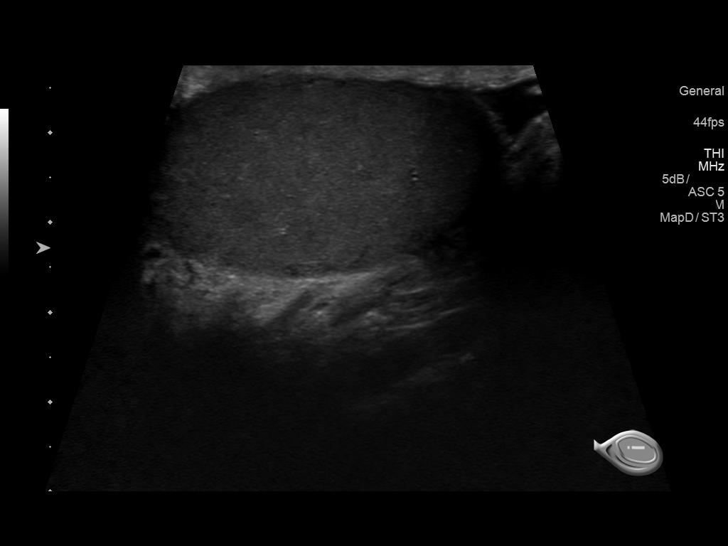
[im 42/68]
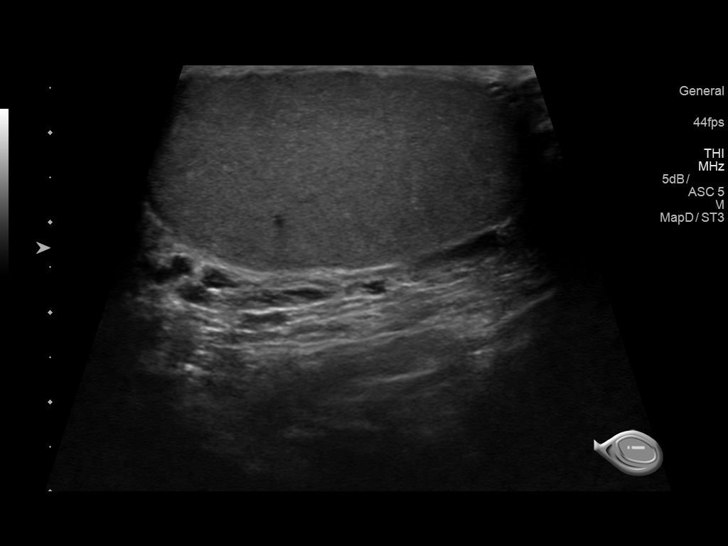
[im 45/68]
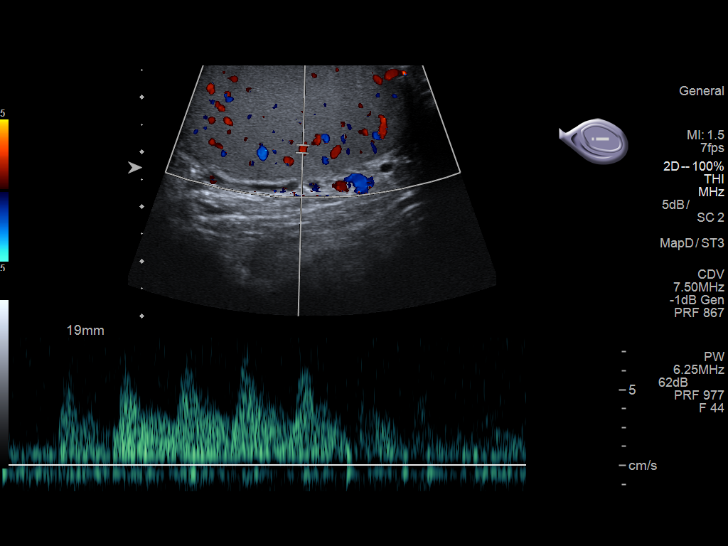
[im 51/68]
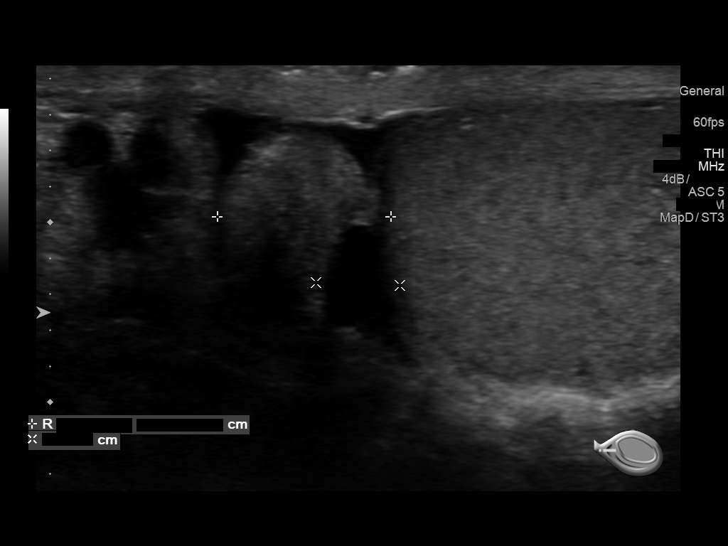
[im 56/68]
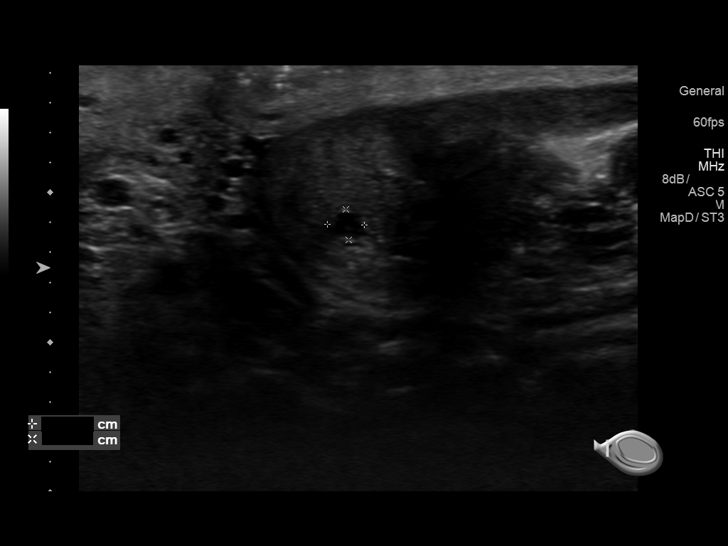
[im 62/68]
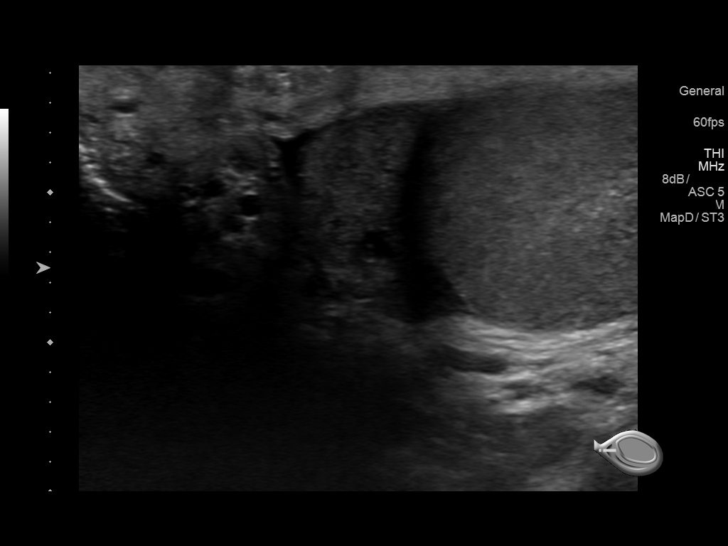
[im 68/68]
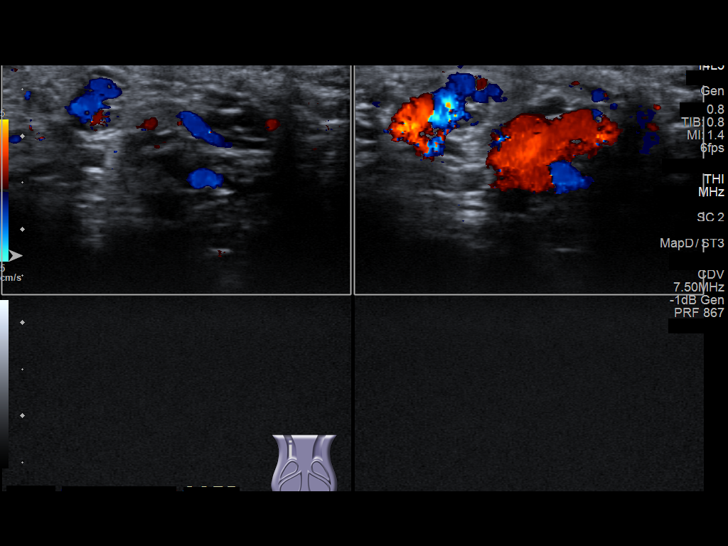

[14 of 25 positions shown; findings below may reference images not displayed]

FINDINGS: Right testicle

Measurements: 4.9 x 2.6 x 3.1 cm. No mass or microlithiasis
visualized.

Left testicle

Measurements: 4.9 x 2.8 x 3.4 cm. No mass or microlithiasis
visualized.

Right epididymis:  5 mm epididymal cyst

Left epididymis:  3 mm epididymal cyst

Hydrocele:  Small bilateral hydrocele

Varicocele:  None visualized.

Pulsed Doppler interrogation of both testes demonstrates normal low
resistance arterial and venous waveforms bilaterally.
IMPRESSION: Small bilateral hydrocele. Negative for abscess or mass. Negative
for testicular torsion.

Small bilateral hydrocele.

## 2020-11-21 ENCOUNTER — Other Ambulatory Visit: Payer: Self-pay | Admitting: Orthopedic Surgery

## 2020-11-21 DIAGNOSIS — M5459 Other low back pain: Secondary | ICD-10-CM

## 2020-12-01 ENCOUNTER — Other Ambulatory Visit: Payer: Self-pay

## 2020-12-01 ENCOUNTER — Ambulatory Visit
Admission: RE | Admit: 2020-12-01 | Discharge: 2020-12-01 | Disposition: A | Discharge status: Home / Self Care | Payer: 59 | Source: Ambulatory Visit | Attending: Orthopedic Surgery | Admitting: Orthopedic Surgery

## 2020-12-01 DIAGNOSIS — M5459 Other low back pain: Secondary | ICD-10-CM

## 2023-10-06 DIAGNOSIS — M545 Low back pain, unspecified: Secondary | ICD-10-CM | POA: Diagnosis not present

## 2023-11-13 DIAGNOSIS — Z1331 Encounter for screening for depression: Secondary | ICD-10-CM | POA: Diagnosis not present

## 2023-11-13 DIAGNOSIS — E663 Overweight: Secondary | ICD-10-CM | POA: Diagnosis not present

## 2023-11-13 DIAGNOSIS — Z6828 Body mass index (BMI) 28.0-28.9, adult: Secondary | ICD-10-CM | POA: Diagnosis not present

## 2023-11-13 DIAGNOSIS — E1165 Type 2 diabetes mellitus with hyperglycemia: Secondary | ICD-10-CM | POA: Diagnosis not present

## 2023-11-13 DIAGNOSIS — Z0000 Encounter for general adult medical examination without abnormal findings: Secondary | ICD-10-CM | POA: Diagnosis not present

## 2023-12-14 DIAGNOSIS — U071 COVID-19: Secondary | ICD-10-CM | POA: Diagnosis not present

## 2024-02-17 DIAGNOSIS — E785 Hyperlipidemia, unspecified: Secondary | ICD-10-CM | POA: Diagnosis not present

## 2024-02-17 DIAGNOSIS — E119 Type 2 diabetes mellitus without complications: Secondary | ICD-10-CM | POA: Diagnosis not present

## 2024-02-17 DIAGNOSIS — I10 Essential (primary) hypertension: Secondary | ICD-10-CM | POA: Diagnosis not present

## 2024-02-17 DIAGNOSIS — E291 Testicular hypofunction: Secondary | ICD-10-CM | POA: Diagnosis not present
